# Patient Record
Sex: Male | Born: 1980 | Hispanic: Yes | Marital: Married | Smoking: Never smoker
Health system: Southern US, Community
[De-identification: ages and names within clinical notes are randomized; demographics above are authoritative.]

## PROBLEM LIST (undated history)

## (undated) HISTORY — PX: TONSILLECTOMY: SUR1361

## (undated) HISTORY — PX: SINOSCOPY: SHX187

---

## 2011-04-04 ENCOUNTER — Encounter: Payer: Self-pay | Admitting: Family Medicine

## 2011-04-04 ENCOUNTER — Ambulatory Visit (INDEPENDENT_AMBULATORY_CARE_PROVIDER_SITE_OTHER): Payer: BC Managed Care – PPO | Admitting: Family Medicine

## 2011-04-04 VITALS — BP 125/78 | HR 79 | Ht 64.0 in | Wt 190.0 lb

## 2011-04-04 DIAGNOSIS — Z9109 Other allergy status, other than to drugs and biological substances: Secondary | ICD-10-CM

## 2011-04-04 DIAGNOSIS — Z0000 Encounter for general adult medical examination without abnormal findings: Secondary | ICD-10-CM

## 2011-04-04 DIAGNOSIS — Z23 Encounter for immunization: Secondary | ICD-10-CM

## 2011-04-04 DIAGNOSIS — B353 Tinea pedis: Secondary | ICD-10-CM

## 2011-04-04 DIAGNOSIS — Z889 Allergy status to unspecified drugs, medicaments and biological substances status: Secondary | ICD-10-CM

## 2011-04-04 MED ORDER — FLUTICASONE PROPIONATE 50 MCG/ACT NA SUSP: 2.0000 | Freq: Every day | NASAL | Status: DC

## 2011-04-04 MED ORDER — FEXOFENADINE-PSEUDOEPHED ER 180-240 MG PO TB24: 1.0000 | ORAL_TABLET | Freq: Every day | ORAL | Status: AC

## 2011-04-04 MED ORDER — MOMETASONE FUROATE 0.1 % EX CREA: TOPICAL_CREAM | CUTANEOUS | Status: AC

## 2011-04-04 MED ORDER — KETOCONAZOLE 2 % EX CREA: TOPICAL_CREAM | Freq: Every day | CUTANEOUS | Status: DC

## 2011-04-04 MED ORDER — MOMETASONE FUROATE 50 MCG/ACT NA SUSP: 2.0000 | Freq: Every day | NASAL | Status: DC

## 2011-04-04 NOTE — Patient Instructions (Signed)
Flu vaccine to be given. Health maintenance in a bout 2 months to have blood work drawn 2- 3 days ahead of visit nizoral 2 x a day & elocon qd nasonex as directed and allegra D a day prn

## 2011-04-07 NOTE — Progress Notes (Signed)
  Subjective:    Patient ID: Paul Leon, male    DOB: [birth year], 30 y.o.   MRN: [id number]  HPI #1 Health maintence #2 need for flu vaccination #3 foot peeling/fungal infection #4 allergies  Review of Systems  HENT: Positive for congestion, rhinorrhea and postnasal drip.   Genitourinary:       Due to some infertility challenges they will be seeing afertility doctor in the near future  Skin:       Both feet peeling and itching present  All other systems reviewed and are negative.       Objective:   Physical Exam  Constitutional: He is oriented to person, place, and time. He appears well-developed and well-nourished.  HENT:  Head: Normocephalic.  Right Ear: Hearing normal.  Left Ear: Hearing normal.  Nose: Mucosal edema present. No rhinorrhea, sinus tenderness, nasal deformity or septal deviation. Right sinus exhibits no maxillary sinus tenderness and no frontal sinus tenderness. Left sinus exhibits no maxillary sinus tenderness and no frontal sinus tenderness.  Mouth/Throat: Uvula is midline, oropharynx is clear and moist and mucous membranes are normal. No oral lesions. Normal dentition.  Neck: Neck supple.  Neurological: He is alert and oriented to person, place, and time.  Skin: Skin is warm.       Foot rash consistent with tinea infection          Assessment & Plan:  #1 Will have him return for health maintenance #2 Will be given #3 treat w/steroid for about  A week and antifungal 2x a day for 2-3 weeks  Flu vaccine to be given. Health maintenance in a bout 2 months to have blood work drawn 2- 3 days ahead of visit nizoral 2 x a day & elocon qd nasonex as directed and allegra D a day prn

## 2011-04-18 ENCOUNTER — Encounter: Payer: BC Managed Care – PPO | Admitting: Family Medicine

## 2011-04-18 DIAGNOSIS — Z0289 Encounter for other administrative examinations: Secondary | ICD-10-CM

## 2011-11-15 ENCOUNTER — Telehealth: Payer: Self-pay | Admitting: *Deleted

## 2011-11-15 DIAGNOSIS — B353 Tinea pedis: Secondary | ICD-10-CM

## 2011-11-15 MED ORDER — KETOCONAZOLE 2 % EX CREA: TOPICAL_CREAM | Freq: Every day | CUTANEOUS | Status: AC

## 2011-11-15 NOTE — Telephone Encounter (Signed)
Request refill for ketoconazole 2%. Last filled 03/2011. Please advise on refill.

## 2012-05-09 ENCOUNTER — Other Ambulatory Visit: Payer: Self-pay | Admitting: Family Medicine

## 2019-06-23 DIAGNOSIS — M25622 Stiffness of left elbow, not elsewhere classified: Secondary | ICD-10-CM | POA: Insufficient documentation

## 2020-02-07 ENCOUNTER — Other Ambulatory Visit: Payer: Self-pay

## 2020-02-07 ENCOUNTER — Encounter: Payer: Self-pay | Admitting: Family Medicine

## 2020-02-07 ENCOUNTER — Ambulatory Visit (INDEPENDENT_AMBULATORY_CARE_PROVIDER_SITE_OTHER): Payer: 59 | Admitting: Family Medicine

## 2020-02-07 VITALS — BP 133/84 | HR 85 | Temp 98.0°F | Ht 64.17 in | Wt 225.7 lb

## 2020-02-07 DIAGNOSIS — M25531 Pain in right wrist: Secondary | ICD-10-CM

## 2020-02-07 DIAGNOSIS — M79605 Pain in left leg: Secondary | ICD-10-CM | POA: Insufficient documentation

## 2020-02-07 DIAGNOSIS — R2231 Localized swelling, mass and lump, right upper limb: Secondary | ICD-10-CM | POA: Insufficient documentation

## 2020-02-07 DIAGNOSIS — M79604 Pain in right leg: Secondary | ICD-10-CM

## 2020-02-07 MED ORDER — MELOXICAM 15 MG PO TABS: 15.0000 mg | ORAL_TABLET | Freq: Every day | ORAL | 0 refills | Status: DC | PRN

## 2020-02-07 NOTE — Assessment & Plan Note (Addendum)
Checking CK and CMP today.  Trial of meloxicam as needed.   Reminded to stay well hydrated while working.

## 2020-02-07 NOTE — Assessment & Plan Note (Signed)
Possible ganglion cyst along ulnar side of wrist.  Will try anti-inflammatory initially, if not improving will plan to have him see sports medicine.

## 2020-02-07 NOTE — Patient Instructions (Signed)
Nice to meet you today! Have labs completed downstairs.  Try meloxicam as needed for pain See me again in about 6 weeks.

## 2020-02-07 NOTE — Progress Notes (Signed)
Paul Leon - 39 y.o. male MRN [id number]  Date of birth: 07/11/1980  Subjective Chief Complaint  Patient presents with  . Establish Care    HPI Paul Leon is a 38 y.o. male here today for initial visit.Marland Kitchen  He has been in fairly good health and denies any long term medical problems that he is aware of.   He has concerns today of wrist pain and bilateral leg pain.    Reports that wrist pain started a couple of weeks ago.  He has a nodule on the ulnar side of wrist that is mildly tender.  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  He has not had any associated numbness or tingling.    He also has had bilateral leg pain.  Pain is throughout entire leg. He job does require quite a bit of climbing.  This does make pain worse.  He does stay well hydrated while working.    ROS:  A comprehensive ROS was completed and negative except as noted per HPI  Allergies  Allergen Reactions  . Pork Allergy Rash    History reviewed. No pertinent past medical history.  Past Surgical History:  Procedure Laterality Date  . SINOSCOPY    . TONSILLECTOMY      Social History   Socioeconomic History  . Marital status: Married    Spouse name: Not on file  . Number of children: 1  . Years of education: Not on file  . Highest education level: Not on file  Occupational History  . Not on file  Tobacco Use  . Smoking status: Never Smoker  . Smokeless tobacco: Never Used  Vaping Use  . Vaping Use: Never used  Substance and Sexual Activity  . Alcohol use: Yes    Alcohol/week: 2.0 - 3.0 standard drinks    Types: 2 - 3 Cans of beer per week  . Drug use: Never  . Sexual activity: Yes    Partners: Female  Other Topics Concern  . Not on file  Social History Narrative  . Not on file   Social Determinants of Health   Financial Resource Strain:   . Difficulty of Paying Living Expenses: Not on file  Food Insecurity:   . Worried About Programme researcher, broadcasting/film/video in the Last Year: Not on  file  . Ran Out of Food in the Last Year: Not on file  Transportation Needs:   . Lack of Transportation (Medical): Not on file  . Lack of Transportation (Non-Medical): Not on file  Physical Activity:   . Days of Exercise per Week: Not on file  . Minutes of Exercise per Session: Not on file  Stress:   . Feeling of Stress : Not on file  Social Connections:   . Frequency of Communication with Friends and Family: Not on file  . Frequency of Social Gatherings with Friends and Family: Not on file  . Attends Religious Services: Not on file  . Active Member of Clubs or Organizations: Not on file  . Attends Banker Meetings: Not on file  . Marital Status: Not on file    Family History  Problem Relation Age of Onset  . Leukemia Maternal Grandfather   . Diabetes Paternal Grandfather     Health Maintenance  Topic Date Due  . Hepatitis C Screening  Never done  . HIV Screening  Never done  . COVID-19 Vaccine (2 - Pfizer 2-dose series) 10/13/2019  . INFLUENZA VACCINE  01/16/2020  .  TETANUS/TDAP  07/02/2025     ----------------------------------------------------------------------------------------------------------------------------------------------------------------------------------------------------------------- Physical Exam BP 133/84 (BP Location: Left Arm, Patient Position: Sitting, Cuff Size: Large)   Pulse 85   Temp 98 F (36.7 C) (Temporal)   Ht 5' 4.17" (1.63 m)   Wt 225 lb 11.2 oz (102.4 kg)   SpO2 98%   BMI 38.53 kg/m   Physical Exam Constitutional:      Appearance: Normal appearance.  Eyes:     General: No scleral icterus. Cardiovascular:     Rate and Rhythm: Normal rate and regular rhythm.  Pulmonary:     Effort: Pulmonary effort is normal.     Breath sounds: Normal breath sounds.  Musculoskeletal:     Cervical back: Neck supple.     Comments: Cystic appearing nodule on ulnar side or R wrist.  Mild TTP with radial deviation.    Legs without  TTP.  ROM and strength are normal.    Skin:    General: Skin is warm and dry.  Neurological:     General: No focal deficit present.     Mental Status: He is alert.  Psychiatric:        Mood and Affect: Mood normal.     ------------------------------------------------------------------------------------------------------------------------------------------------------------------------------------------------------------------- Assessment and Plan  Wrist pain Possible ganglion cyst along ulnar side of wrist.  Will try anti-inflammatory initially, if not improving will plan to have him see sports medicine.  Pain in both lower extremities Checking CK and CMP today.  Trial of meloxicam as needed.   Reminded to stay well hydrated while working.     Meds ordered this encounter  Medications  . DISCONTD: meloxicam (MOBIC) 15 MG tablet    Sig: Take 1 tablet (15 mg total) by mouth daily as needed for pain.    Dispense:  30 tablet    Refill:  0  . meloxicam (MOBIC) 15 MG tablet    Sig: Take 1 tablet (15 mg total) by mouth daily as needed for pain.    Dispense:  30 tablet    Refill:  0    Return in about 6 weeks (around 03/20/2020) for leg pain.    This visit occurred during the SARS-CoV-2 public health emergency.  Safety protocols were in place, including screening questions prior to the visit, additional usage of staff PPE, and extensive cleaning of exam room while observing appropriate contact time as indicated for disinfecting solutions.

## 2020-02-08 LAB — COMPLETE METABOLIC PANEL WITH GFR
AG Ratio: 1.8 (calc) (ref 1.0–2.5)
ALT: 31 U/L (ref 9–46)
AST: 16 U/L (ref 10–40)
Albumin: 4.4 g/dL (ref 3.6–5.1)
Alkaline phosphatase (APISO): 93 U/L (ref 36–130)
BUN: 14 mg/dL (ref 7–25)
CO2: 28 mmol/L (ref 20–32)
Calcium: 9.3 mg/dL (ref 8.6–10.3)
Chloride: 103 mmol/L (ref 98–110)
Creat: 0.94 mg/dL (ref 0.60–1.35)
GFR, Est African American: 117 mL/min/{1.73_m2} (ref >=60)
GFR, Est Non African American: 101 mL/min/{1.73_m2} (ref >=60)
Globulin: 2.5 g/dL (calc) (ref 1.9–3.7)
Glucose, Bld: 107 mg/dL (ref 65–139)
Potassium: 4.3 mmol/L (ref 3.5–5.3)
Sodium: 138 mmol/L (ref 135–146)
Total Bilirubin: 0.2 mg/dL (ref 0.2–1.2)
Total Protein: 6.9 g/dL (ref 6.1–8.1)

## 2020-02-08 LAB — CK: Total CK: 181 U/L (ref 44–196)

## 2020-03-05 ENCOUNTER — Other Ambulatory Visit: Payer: Self-pay | Admitting: Family Medicine

## 2020-03-20 ENCOUNTER — Other Ambulatory Visit: Payer: Self-pay

## 2020-03-20 ENCOUNTER — Encounter: Payer: Self-pay | Admitting: Family Medicine

## 2020-03-20 ENCOUNTER — Ambulatory Visit (INDEPENDENT_AMBULATORY_CARE_PROVIDER_SITE_OTHER): Payer: 59 | Admitting: Family Medicine

## 2020-03-20 ENCOUNTER — Ambulatory Visit (INDEPENDENT_AMBULATORY_CARE_PROVIDER_SITE_OTHER): Payer: 59

## 2020-03-20 VITALS — BP 126/86 | HR 86 | Temp 98.0°F | Wt 223.0 lb

## 2020-03-20 DIAGNOSIS — M25472 Effusion, left ankle: Secondary | ICD-10-CM

## 2020-03-20 DIAGNOSIS — M79605 Pain in left leg: Secondary | ICD-10-CM

## 2020-03-20 DIAGNOSIS — M25572 Pain in left ankle and joints of left foot: Secondary | ICD-10-CM

## 2020-03-20 DIAGNOSIS — M25531 Pain in right wrist: Secondary | ICD-10-CM | POA: Diagnosis not present

## 2020-03-20 DIAGNOSIS — M79604 Pain in right leg: Secondary | ICD-10-CM

## 2020-03-20 DIAGNOSIS — K219 Gastro-esophageal reflux disease without esophagitis: Secondary | ICD-10-CM | POA: Diagnosis not present

## 2020-03-20 MED ORDER — OMEPRAZOLE 40 MG PO CPDR: 40.0000 mg | DELAYED_RELEASE_CAPSULE | Freq: Every day | ORAL | 3 refills | Status: DC

## 2020-03-20 NOTE — Assessment & Plan Note (Signed)
Will obtain xray of L ankle.  Does not appear to be related to gout.  Recommend trying compression sleeve or ankle brace when working.

## 2020-03-20 NOTE — Patient Instructions (Signed)
Have xray of left ankle completed.  See Dr. Karie Schwalbe for the area on your wrist.

## 2020-03-20 NOTE — Progress Notes (Signed)
Paul Leon - 39 y.o. male MRN [id number]  Date of birth: [birth year]  Subjective Chief Complaint  Patient presents with  . Leg Pain    HPI Paul Leon is a 39 y.o. male here today here today for follow up of leg and wrist pain.  R leg pain has improved but he continues to have L ankle pain.  He is now having some swelling of his ankle. He denies any known injury of this ankle.  He denies numbness/tingling of the foot.  There is no redness or warmth.   He continues to have pain in the right wrist.  He has some numbness in 4th and 5th digit.  He has cystic like mass over ulna.  This area is non-tender.   ROS:  A comprehensive ROS was completed and negative except as noted per HPI  Allergies  Allergen Reactions  . Pork Allergy Rash    History reviewed. No pertinent past medical history.  Past Surgical History:  Procedure Laterality Date  . SINOSCOPY    . TONSILLECTOMY      Social History   Socioeconomic History  . Marital status: Married    Spouse name: Not on file  . Number of children: 1  . Years of education: Not on file  . Highest education level: Not on file  Occupational History  . Not on file  Tobacco Use  . Smoking status: Never Smoker  . Smokeless tobacco: Never Used  Vaping Use  . Vaping Use: Never used  Substance and Sexual Activity  . Alcohol use: Yes    Alcohol/week: 2.0 - 3.0 standard drinks    Types: 2 - 3 Cans of beer per week  . Drug use: Never  . Sexual activity: Yes    Partners: Female  Other Topics Concern  . Not on file  Social History Narrative  . Not on file   Social Determinants of Health   Financial Resource Strain:   . Difficulty of Paying Living Expenses: Not on file  Food Insecurity:   . Worried About Programme researcher, broadcasting/film/video in the Last Year: Not on file  . Ran Out of Food in the Last Year: Not on file  Transportation Needs:   . Lack of Transportation (Medical): Not on file  . Lack of Transportation (Non-Medical): Not on file   Physical Activity:   . Days of Exercise per Week: Not on file  . Minutes of Exercise per Session: Not on file  Stress:   . Feeling of Stress : Not on file  Social Connections:   . Frequency of Communication with Friends and Family: Not on file  . Frequency of Social Gatherings with Friends and Family: Not on file  . Attends Religious Services: Not on file  . Active Member of Clubs or Organizations: Not on file  . Attends Banker Meetings: Not on file  . Marital Status: Not on file    Family History  Problem Relation Age of Onset  . Leukemia Maternal Grandfather   . Diabetes Paternal Grandfather     Health Maintenance  Topic Date Due  . Hepatitis C Screening  Never done  . HIV Screening  Never done  . COVID-19 Vaccine (2 - Pfizer 2-dose series) 10/13/2019  . INFLUENZA VACCINE  01/16/2020  . TETANUS/TDAP  07/02/2025     ----------------------------------------------------------------------------------------------------------------------------------------------------------------------------------------------------------------- Physical Exam BP 126/86   Pulse 86   Temp 98 F (36.7 C)   Wt 223 lb (101.2 kg)   SpO2 94%  BMI 38.07 kg/m   Physical Exam Constitutional:      Appearance: Normal appearance.  HENT:     Head: Normocephalic and atraumatic.  Eyes:     General: No scleral icterus. Cardiovascular:     Rate and Rhythm: Normal rate and regular rhythm.  Pulmonary:     Effort: Pulmonary effort is normal.     Breath sounds: Normal breath sounds.  Musculoskeletal:     Cervical back: Neck supple.  Neurological:     General: No focal deficit present.     Mental Status: He is alert.  Psychiatric:        Mood and Affect: Mood normal.        Behavior: Behavior normal.      ------------------------------------------------------------------------------------------------------------------------------------------------------------------------------------------------------------------- Assessment and Plan  Wrist pain He has symptoms consistent with ulnar neuropathy.  Possibly due to compression from cystic mass on ulna.  I favor this to be a ganglion cyst and will have him see Dr. Karie Schwalbe for further evaluation and management.  Pain in both lower extremities Will obtain xray of L ankle.  Does not appear to be related to gout.  Recommend trying compression sleeve or ankle brace when working.    GERD (gastroesophageal reflux disease) Previously on omeprazole, will restart at 40mg  daily.     Meds ordered this encounter  Medications  . omeprazole (PRILOSEC) 40 MG capsule    Sig: Take 1 capsule (40 mg total) by mouth daily.    Dispense:  30 capsule    Refill:  3    No follow-ups on file.    This visit occurred during the SARS-CoV-2 public health emergency.  Safety protocols were in place, including screening questions prior to the visit, additional usage of staff PPE, and extensive cleaning of exam room while observing appropriate contact time as indicated for disinfecting solutions.

## 2020-03-20 NOTE — Assessment & Plan Note (Signed)
Previously on omeprazole, will restart at 40mg  daily.

## 2020-03-20 NOTE — Assessment & Plan Note (Signed)
He has symptoms consistent with ulnar neuropathy.  Possibly due to compression from cystic mass on ulna.  I favor this to be a ganglion cyst and will have him see Dr. Karie Schwalbe for further evaluation and management.

## 2020-03-27 ENCOUNTER — Encounter: Payer: Self-pay | Admitting: Sports Medicine

## 2020-03-27 ENCOUNTER — Other Ambulatory Visit: Payer: Self-pay

## 2020-03-27 ENCOUNTER — Ambulatory Visit: Payer: Self-pay

## 2020-03-27 ENCOUNTER — Ambulatory Visit (INDEPENDENT_AMBULATORY_CARE_PROVIDER_SITE_OTHER): Payer: 59

## 2020-03-27 ENCOUNTER — Ambulatory Visit (INDEPENDENT_AMBULATORY_CARE_PROVIDER_SITE_OTHER): Payer: 59 | Admitting: Sports Medicine

## 2020-03-27 DIAGNOSIS — M25562 Pain in left knee: Secondary | ICD-10-CM

## 2020-03-27 DIAGNOSIS — M1712 Unilateral primary osteoarthritis, left knee: Secondary | ICD-10-CM

## 2020-03-27 DIAGNOSIS — R2231 Localized swelling, mass and lump, right upper limb: Secondary | ICD-10-CM

## 2020-03-27 NOTE — Assessment & Plan Note (Signed)
There is some complaints of swelling in his left lower leg, he has a negative Homans' sign, he does have tenderness at the medial joint line, moderate knee pain as well. I think the swelling is from his knee, injected today because he did fail meloxicam. Getting some x-rays. Return to see me in 4 to 6 weeks.

## 2020-03-27 NOTE — Assessment & Plan Note (Addendum)
Paul Leon also has a mass on his right wrist, over the ECU. I think it is a ganglion, I am going to go ahead and drain and inject this today as well. Return to see me in a month.

## 2020-03-27 NOTE — Progress Notes (Signed)
    Procedures performed today:    Procedure: Real-time Ultrasound Guided injection of the left knee Device: Samsung HS60  Verbal informed consent obtained.  Time-out conducted.  Noted no overlying erythema, induration, or other signs of local infection.  Skin prepped in a sterile fashion.  Local anesthesia: Topical Ethyl chloride.  With sterile technique and under real time ultrasound guidance: 1 cc Kenalog 40, 2 cc lidocaine, 2 cc bupivacaine injected easily Completed without difficulty  Pain immediately resolved suggesting accurate placement of the medication.  Advised to call if fevers/chills, erythema, induration, drainage, or persistent bleeding.  Images permanently stored and available for review in PACS.  Impression: Technically successful ultrasound guided injection.  Procedure: Real-time Ultrasound Guided aspiration/injection of right ulnar wrist ganglion Device: Samsung HS60  Verbal informed consent obtained.  Time-out conducted.  Noted no overlying erythema, induration, or other signs of local infection.  Skin prepped in a sterile fashion.  Local anesthesia: Topical Ethyl chloride.  With sterile technique and under real time ultrasound guidance:  Using 18-gauge needle I aspirated a few milliliters of thick, straw-colored fluid, syringe switched and 1 cc kenalog 40 injected easily.   Completed without difficulty  Pain immediately resolved suggesting accurate placement of the medication.  Advised to call if fevers/chills, erythema, induration, drainage, or persistent bleeding.  Images permanently stored and available for review in PACS.  Impression: Technically successful ultrasound guided injection.  Independent interpretation of notes and tests performed by another provider:   None.  Brief History, Exam, Impression, and Recommendations:    Mass of right wrist Lloyd Huger also has a mass on his right wrist, over the ECU. I think it is a ganglion, I am going to go ahead and  drain and inject this today as well. Return to see me in a month.  Primary osteoarthritis of left knee There is some complaints of swelling in his left lower leg, he has a negative Homans' sign, he does have tenderness at the medial joint line, moderate knee pain as well. I think the swelling is from his knee, injected today because he did fail meloxicam. Getting some x-rays. Return to see me in 4 to 6 weeks.    ___________________________________________ Paul Leon. Benjamin Stain, M.D., ABFM., CAQSM. Primary Care and Sports Medicine [facility]  Adjunct Instructor of Family Medicine  [employer]

## 2020-03-28 ENCOUNTER — Telehealth: Payer: Self-pay | Admitting: *Deleted

## 2020-03-28 NOTE — Telephone Encounter (Signed)
Letter written

## 2020-03-28 NOTE — Telephone Encounter (Signed)
Pt's wife left vm that yesterday during the appointment the pt denied wanting to be written out of work for this next week but has changed his mind. He would like for you to write the letter now.

## 2020-03-28 NOTE — Telephone Encounter (Signed)
Pt's wife notified of letter.

## 2020-04-24 ENCOUNTER — Ambulatory Visit (INDEPENDENT_AMBULATORY_CARE_PROVIDER_SITE_OTHER): Payer: 59 | Admitting: Sports Medicine

## 2020-04-24 DIAGNOSIS — M1712 Unilateral primary osteoarthritis, left knee: Secondary | ICD-10-CM

## 2020-04-24 NOTE — Progress Notes (Signed)
    Procedures performed today:    None.  Independent interpretation of notes and tests performed by another provider:   None.  Brief History, Exam, Impression, and Recommendations:    Primary osteoarthritis of left knee This is a very pleasant 39 year old male, we injected his knee at the last visit, he did extremely well for the first week or 2, and then started to have a slight recurrence of pain, he is still 50 to 60% better. He has really not been taking his meloxicam so he will get more diligent with this, starting at one half tab at lunchtime. Pain is only when he gets home. He will also wear knee brace, return to see me in a month, if persistent discomfort we will proceed with MRI.    ___________________________________________ Ihor Austin. Benjamin Stain, M.D., ABFM., CAQSM. Primary Care and Sports Medicine [facility]  Adjunct Instructor of Family Medicine  [employer]

## 2020-04-24 NOTE — Assessment & Plan Note (Signed)
This is a very pleasant 40 year old male, we injected his knee at the last visit, he did extremely well for the first week or 2, and then started to have a slight recurrence of pain, he is still 50 to 60% better. He has really not been taking his meloxicam so he will get more diligent with this, starting at one half tab at lunchtime. Pain is only when he gets home. He will also wear knee brace, return to see me in a month, if persistent discomfort we will proceed with MRI.

## 2020-05-22 ENCOUNTER — Ambulatory Visit: Payer: 59 | Admitting: Family Medicine

## 2020-06-19 ENCOUNTER — Encounter: Payer: Self-pay | Admitting: Physician Assistant

## 2020-06-19 ENCOUNTER — Telehealth (INDEPENDENT_AMBULATORY_CARE_PROVIDER_SITE_OTHER): Payer: 59 | Admitting: Physician Assistant

## 2020-06-19 VITALS — Temp 98.9°F

## 2020-06-19 DIAGNOSIS — J40 Bronchitis, not specified as acute or chronic: Secondary | ICD-10-CM | POA: Diagnosis not present

## 2020-06-19 DIAGNOSIS — J329 Chronic sinusitis, unspecified: Secondary | ICD-10-CM | POA: Diagnosis not present

## 2020-06-19 MED ORDER — AZITHROMYCIN 250 MG PO TABS: ORAL_TABLET | ORAL | 0 refills | Status: DC

## 2020-06-19 MED ORDER — FLUTICASONE PROPIONATE 50 MCG/ACT NA SUSP: 2.0000 | Freq: Every day | NASAL | 2 refills | Status: DC

## 2020-06-19 MED ORDER — FLUTICASONE PROPIONATE 50 MCG/ACT NA SUSP
2.0000 | Freq: Every day | NASAL | 1 refills | Status: DC
Start: 2020-06-19 — End: 2021-05-17

## 2020-06-19 NOTE — Progress Notes (Signed)
..  Virtual Visit via Telephone Note  I connected with Paul Leon on 06/19/2020 at  3:20 PM EST by telephone and verified that I am speaking with the correct person using two identifiers.  Location: Patient: home Provider: home  .Marland KitchenParticipating in visit:  Patient: Paul Leon Patient wife: [name] Provider: Tandy Gaw PA-C   I discussed the limitations, risks, security and privacy concerns of performing an evaluation and management service by telephone and the availability of in person appointments. I also discussed with the patient that there may be a patient responsible charge related to this service. The patient expressed understanding and agreed to proceed.   History of Present Illness: Pt is a 40 yo male who calls into the clinic with his wife. he started having URI symptoms about 4 days ago. His wife was sick about 4 days before that. She was seen by Dr. Linford Arnold and given zpak after flu/covid negative testing. His wife states "he has the same symptoms". theraflu does help some. He has some head congestion, sinus pressure, runny nose and cough. No wheezing or SOB. His has had covid vaccine. No flu shot.  .. Active Ambulatory Problems    Diagnosis Date Noted  . Elbow stiffness, left 06/23/2019  . Pain in both lower extremities 02/07/2020  . Mass of right wrist 02/07/2020  . GERD (gastroesophageal reflux disease) 03/20/2020  . Primary osteoarthritis of left knee 03/27/2020   Resolved Ambulatory Problems    Diagnosis Date Noted  . No Resolved Ambulatory Problems   No Additional Past Medical History   Reviewed med, allergy, problem list.     Observations/Objective: No acute distress Normal breathing and speaking in sentences Productive cough  Temp 98.9    Assessment and Plan: Marland KitchenMarland KitchenDiagnoses and all orders for this visit:  Sinobronchitis -     azithromycin (ZITHROMAX Z-PAK) 250 MG tablet; Take 2 tablets (500 mg) on  Day 1,  followed by 1 tablet (250 mg) once daily  on Days 2 through 5. -     fluticasone (FLONASE) 50 MCG/ACT nasal spray; Place 2 sprays into both nostrils daily. -     fluticasone (FLONASE) 50 MCG/ACT nasal spray; Place 2 sprays into both nostrils daily.   Discussed with patient symptoms sound more viral. Wife did test negative and did have household exposure. No new testing needed. Continue to wear a mask. I would continue theraflu and flonase for another 2 to 3 days. If not improving then add zpak. Wife wanted what she was given to make well. Discussed if added too soon may not be needed and cause abx resistance and GI issues. Rest and hydrate. Wait at least 5 days from symptoms and fever free for 24 hours to go back to work.     Follow Up Instructions:    I discussed the assessment and treatment plan with the patient. The patient was provided an opportunity to ask questions and all were answered. The patient agreed with the plan and demonstrated an understanding of the instructions.   The patient was advised to call back or seek an in-person evaluation if the symptoms worsen or if the condition fails to improve as anticipated.  I provided 10 minutes of non-face-to-face time during this encounter.   Tandy Gaw, PA-C

## 2020-06-20 ENCOUNTER — Other Ambulatory Visit: Payer: Self-pay

## 2020-06-20 ENCOUNTER — Encounter: Payer: Self-pay | Admitting: Physician Assistant

## 2020-06-20 MED ORDER — MELOXICAM 15 MG PO TABS: 15.0000 mg | ORAL_TABLET | Freq: Every day | ORAL | 0 refills | Status: DC

## 2020-07-18 ENCOUNTER — Other Ambulatory Visit: Payer: Self-pay | Admitting: Family Medicine

## 2020-10-12 ENCOUNTER — Other Ambulatory Visit: Payer: Self-pay | Admitting: Family Medicine

## 2020-10-25 ENCOUNTER — Ambulatory Visit (INDEPENDENT_AMBULATORY_CARE_PROVIDER_SITE_OTHER): Payer: BC Managed Care – PPO

## 2020-10-25 ENCOUNTER — Ambulatory Visit (INDEPENDENT_AMBULATORY_CARE_PROVIDER_SITE_OTHER): Payer: BC Managed Care – PPO | Admitting: Family Medicine

## 2020-10-25 ENCOUNTER — Other Ambulatory Visit: Payer: Self-pay

## 2020-10-25 ENCOUNTER — Encounter: Payer: Self-pay | Admitting: Family Medicine

## 2020-10-25 VITALS — BP 122/76 | HR 75 | Temp 98.0°F | Ht 64.57 in | Wt 230.2 lb

## 2020-10-25 DIAGNOSIS — M25571 Pain in right ankle and joints of right foot: Secondary | ICD-10-CM

## 2020-10-25 DIAGNOSIS — M25572 Pain in left ankle and joints of left foot: Secondary | ICD-10-CM

## 2020-10-25 DIAGNOSIS — E782 Mixed hyperlipidemia: Secondary | ICD-10-CM | POA: Insufficient documentation

## 2020-10-25 DIAGNOSIS — M7989 Other specified soft tissue disorders: Secondary | ICD-10-CM | POA: Diagnosis not present

## 2020-10-25 NOTE — Assessment & Plan Note (Addendum)
Symptoms suspicious for gout.  Xrays ordered.  Check uric acid levels and renal function.  He has meloxicam at home and may use this as needed.

## 2020-10-25 NOTE — Assessment & Plan Note (Signed)
Previously on atorvastatin  Update lipid panel.

## 2020-10-25 NOTE — Progress Notes (Signed)
Paul Leon - 40 y.o. male MRN [id number]  Date of birth: 1980-07-10  Subjective Chief Complaint  Patient presents with  . Foot Swelling    HPI Paul Leon is a 40 y.o. male here today with complaint of bilateral ankle swelling and pain.  He has had this off and on in the past.  Most recent episode started a few days ago.  Pain worse when walking.  Did have some warmth to the ankles as well, worse on the R.  She denies numbness or tingling.  He has had knee pain but denies pain in other joints.  His most recent episode did start after having a large meal at a [employer].  Denies fever, chills, chest pain or shortness of breath.    ROS:  A comprehensive ROS was completed and negative except as noted per HPI  Allergies  Allergen Reactions  . Pork Allergy Rash    History reviewed. No pertinent past medical history.  Past Surgical History:  Procedure Laterality Date  . SINOSCOPY    . TONSILLECTOMY      Social History   Socioeconomic History  . Marital status: Married    Spouse name: Not on file  . Number of children: 1  . Years of education: Not on file  . Highest education level: Not on file  Occupational History  . Not on file  Tobacco Use  . Smoking status: Never Smoker  . Smokeless tobacco: Never Used  Vaping Use  . Vaping Use: Never used  Substance and Sexual Activity  . Alcohol use: Yes    Alcohol/week: 2.0 - 3.0 standard drinks    Types: 2 - 3 Cans of beer per week  . Drug use: Never  . Sexual activity: Yes    Partners: Female  Other Topics Concern  . Not on file  Social History Narrative  . Not on file   Social Determinants of Health   Financial Resource Strain: Not on file  Food Insecurity: Not on file  Transportation Needs: Not on file  Physical Activity: Not on file  Stress: Not on file  Social Connections: Not on file    Family History  Problem Relation Age of Onset  . Leukemia Maternal Grandfather   . Diabetes Paternal Grandfather      Health Maintenance  Topic Date Due  . HIV Screening  Never done  . Hepatitis C Screening  Never done  . COVID-19 Vaccine (3 - Booster for Pfizer series) 03/22/2020  . INFLUENZA VACCINE  01/15/2021  . TETANUS/TDAP  07/02/2025  . HPV VACCINES  Aged Out     ----------------------------------------------------------------------------------------------------------------------------------------------------------------------------------------------------------------- Physical Exam BP 122/76 (BP Location: Left Arm, Patient Position: Sitting, Cuff Size: Large)   Pulse 75   Temp 98 F (36.7 C)   Ht 5' 4.57" (1.64 m)   Wt 230 lb 3.2 oz (104.4 kg)   SpO2 97%   BMI 38.82 kg/m   Physical Exam Constitutional:      Appearance: Normal appearance.  HENT:     Head: Normocephalic and atraumatic.  Cardiovascular:     Rate and Rhythm: Normal rate and regular rhythm.  Pulmonary:     Effort: Pulmonary effort is normal.     Breath sounds: Normal breath sounds.  Musculoskeletal:     Comments: Mild swelling and ttp along bilateral ankle joints without erythema.  Neurological:     Mental Status: He is alert.     ------------------------------------------------------------------------------------------------------------------------------------------------------------------------------------------------------------------- Assessment and Plan  Mixed hyperlipidemia Previously on atorvastatin  Update  lipid panel.   Acute bilateral ankle pain Symptoms suspicious for gout.  Xrays ordered.  Check uric acid levels and renal function.  He has meloxicam at home and may use this as needed.     No orders of the defined types were placed in this encounter.   No follow-ups on file.    This visit occurred during the SARS-CoV-2 public health emergency.  Safety protocols were in place, including screening questions prior to the visit, additional usage of staff PPE, and extensive cleaning of  exam room while observing appropriate contact time as indicated for disinfecting solutions.

## 2020-10-25 NOTE — Patient Instructions (Signed)
Have labs and xrays completed.  We'll be in touch with results.  Try meloxicam as needed.

## 2020-10-26 LAB — COMPLETE METABOLIC PANEL WITH GFR
AG Ratio: 1.5 (calc) (ref 1.0–2.5)
ALT: 53 U/L — ABNORMAL HIGH (ref 9–46)
AST: 30 U/L (ref 10–40)
Albumin: 4.4 g/dL (ref 3.6–5.1)
Alkaline phosphatase (APISO): 91 U/L (ref 36–130)
BUN: 15 mg/dL (ref 7–25)
CO2: 24 mmol/L (ref 20–32)
Calcium: 9.7 mg/dL (ref 8.6–10.3)
Chloride: 103 mmol/L (ref 98–110)
Creat: 0.78 mg/dL (ref 0.60–1.35)
GFR, Est African American: 131 mL/min/{1.73_m2} (ref >=60)
GFR, Est Non African American: 113 mL/min/{1.73_m2} (ref >=60)
Globulin: 3 g/dL (calc) (ref 1.9–3.7)
Glucose, Bld: 88 mg/dL (ref 65–99)
Potassium: 4.2 mmol/L (ref 3.5–5.3)
Sodium: 138 mmol/L (ref 135–146)
Total Bilirubin: 0.4 mg/dL (ref 0.2–1.2)
Total Protein: 7.4 g/dL (ref 6.1–8.1)

## 2020-10-26 LAB — LIPID PANEL W/REFLEX DIRECT LDL
Cholesterol: 247 mg/dL — ABNORMAL HIGH (ref <200)
HDL: 45 mg/dL (ref >=40)
LDL Cholesterol (Calc): 154 mg/dL (calc) — ABNORMAL HIGH
Non-HDL Cholesterol (Calc): 202 mg/dL (calc) — ABNORMAL HIGH (ref <130)
Total CHOL/HDL Ratio: 5.5 (calc) — ABNORMAL HIGH (ref <5.0)
Triglycerides: 291 mg/dL — ABNORMAL HIGH (ref <150)

## 2020-10-26 LAB — URIC ACID: Uric Acid, Serum: 7.1 mg/dL (ref 4.0–8.0)

## 2020-10-26 LAB — TSH: TSH: 2.1 mIU/L (ref 0.40–4.50)

## 2020-10-27 ENCOUNTER — Encounter: Payer: Self-pay | Admitting: Family Medicine

## 2020-10-30 ENCOUNTER — Other Ambulatory Visit: Payer: Self-pay | Admitting: Family Medicine

## 2020-10-30 MED ORDER — PREDNISONE 50 MG PO TABS: ORAL_TABLET | ORAL | 5 refills | Status: DC

## 2020-11-14 ENCOUNTER — Other Ambulatory Visit: Payer: Self-pay | Admitting: Family Medicine

## 2020-12-27 ENCOUNTER — Other Ambulatory Visit: Payer: Self-pay | Admitting: Family Medicine

## 2021-01-23 ENCOUNTER — Other Ambulatory Visit: Payer: Self-pay | Admitting: Family Medicine

## 2021-02-21 ENCOUNTER — Other Ambulatory Visit: Payer: Self-pay | Admitting: Family Medicine

## 2021-04-24 DIAGNOSIS — Z3009 Encounter for other general counseling and advice on contraception: Secondary | ICD-10-CM | POA: Diagnosis not present

## 2021-04-25 DIAGNOSIS — S76012A Strain of muscle, fascia and tendon of left hip, initial encounter: Secondary | ICD-10-CM | POA: Diagnosis not present

## 2021-04-25 DIAGNOSIS — X501XXA Overexertion from prolonged static or awkward postures, initial encounter: Secondary | ICD-10-CM | POA: Diagnosis not present

## 2021-04-25 DIAGNOSIS — M25552 Pain in left hip: Secondary | ICD-10-CM | POA: Diagnosis not present

## 2021-04-25 DIAGNOSIS — Z91018 Allergy to other foods: Secondary | ICD-10-CM | POA: Diagnosis not present

## 2021-04-25 DIAGNOSIS — M79605 Pain in left leg: Secondary | ICD-10-CM | POA: Diagnosis not present

## 2021-04-25 DIAGNOSIS — M25662 Stiffness of left knee, not elsewhere classified: Secondary | ICD-10-CM | POA: Diagnosis not present

## 2021-04-25 DIAGNOSIS — Z7951 Long term (current) use of inhaled steroids: Secondary | ICD-10-CM | POA: Diagnosis not present

## 2021-04-25 DIAGNOSIS — G8911 Acute pain due to trauma: Secondary | ICD-10-CM | POA: Diagnosis not present

## 2021-05-17 ENCOUNTER — Encounter: Payer: Self-pay | Admitting: Family Medicine

## 2021-05-17 ENCOUNTER — Other Ambulatory Visit: Payer: Self-pay

## 2021-05-17 ENCOUNTER — Ambulatory Visit (INDEPENDENT_AMBULATORY_CARE_PROVIDER_SITE_OTHER): Payer: BC Managed Care – PPO | Admitting: Family Medicine

## 2021-05-17 VITALS — BP 131/71 | HR 89 | Temp 98.3°F | Ht 65.0 in | Wt 222.0 lb

## 2021-05-17 DIAGNOSIS — J0100 Acute maxillary sinusitis, unspecified: Secondary | ICD-10-CM

## 2021-05-17 MED ORDER — AMOXICILLIN 875 MG PO TABS: 875.0000 mg | ORAL_TABLET | Freq: Two times a day (BID) | ORAL | 0 refills | Status: DC

## 2021-05-17 NOTE — Progress Notes (Signed)
Acute Office Visit  Subjective:    Patient ID: Paul Leon, male    DOB: [birth year], 40 y.o.   MRN: [id number]  No chief complaint on file.   HPI Patient is in today for severe nasal congestion and some postnasal drip.  Mild cough.  He has had some sore throat.  No ear pain.  No GI symptoms.  No fevers or chills or sweats.  His wife was seen last week and diagnosed with a sinusitis.  She started feeling bad last week.  His son has been sick with similar symptoms as well but he has been running a fever for 3 days.  No well has not been running a fever.  No loose stools.  Negative home COVID test.  Wife tested negative for flu earlier this week.  He was afraid he might give it to his other children.  History reviewed. No pertinent past medical history.  Past Surgical History:  Procedure Laterality Date   SINOSCOPY     TONSILLECTOMY      Family History  Problem Relation Age of Onset   Leukemia Maternal Grandfather    Diabetes Paternal Grandfather     Social History   Socioeconomic History   Marital status: Married    Spouse name: Not on file   Number of children: 1   Years of education: Not on file   Highest education level: Not on file  Occupational History   Not on file  Tobacco Use   Smoking status: Never   Smokeless tobacco: Never  Vaping Use   Vaping Use: Never used  Substance and Sexual Activity   Alcohol use: Yes    Alcohol/week: 2.0 - 3.0 standard drinks    Types: 2 - 3 Cans of beer per week   Drug use: Never   Sexual activity: Yes    Partners: Female  Other Topics Concern   Not on file  Social History Narrative   Not on file   Social Determinants of Health   Financial Resource Strain: Not on file  Food Insecurity: Not on file  Transportation Needs: Not on file  Physical Activity: Not on file  Stress: Not on file  Social Connections: Not on file  Intimate Partner Violence: Not on file    Outpatient Medications Prior to Visit  Medication Sig  Dispense Refill   atorvastatin (LIPITOR) 10 MG tablet Take by mouth.  (Patient not taking: Reported on 10/25/2020)     fluticasone (FLONASE) 50 MCG/ACT nasal spray Place 2 sprays into both nostrils daily. 16 g 1   meloxicam (MOBIC) 15 MG tablet TAKE 1 TABLET BY MOUTH EVERY DAY 30 tablet 0   omeprazole (PRILOSEC) 40 MG capsule TAKE 1 CAPSULE BY MOUTH EVERY DAY 90 capsule 0   predniSONE (DELTASONE) 50 MG tablet Take 1 tab daily x5 days. 5 tablet 5   No facility-administered medications prior to visit.    Allergies  Allergen Reactions   Pork Allergy Rash    Review of Systems     Objective:    Physical Exam Constitutional:      Appearance: He is well-developed.  HENT:     Head: Normocephalic and atraumatic.     Right Ear: External ear normal.     Left Ear: External ear normal.     Nose: Nose normal.  Eyes:     Conjunctiva/sclera: Conjunctivae normal.     Pupils: Pupils are equal, round, and reactive to light.  Neck:     Thyroid: No thyromegaly.  Cardiovascular:     Rate and Rhythm: Normal rate.     Heart sounds: Normal heart sounds.  Pulmonary:     Effort: Pulmonary effort is normal.     Breath sounds: Normal breath sounds.  Musculoskeletal:     Cervical back: Neck supple.  Lymphadenopathy:     Cervical: No cervical adenopathy.  Skin:    General: Skin is warm and dry.  Neurological:     Mental Status: He is alert and oriented to person, place, and time.    BP 131/71   Pulse 89   Temp 98.3 F (36.8 C)   Ht 5\' 5"  (1.651 m)   Wt 222 lb (100.7 kg)   SpO2 98%   BMI 36.94 kg/m  Wt Readings from Last 3 Encounters:  05/17/21 222 lb (100.7 kg)  10/25/20 230 lb 3.2 oz (104.4 kg)  03/20/20 223 lb (101.2 kg)    Health Maintenance Due  Topic Date Due   HIV Screening  Never done   Hepatitis C Screening  Never done   COVID-19 Vaccine (3 - Booster for Pfizer series) 11/16/2019   INFLUENZA VACCINE  01/15/2021    There are no preventive care reminders to display for  this patient.   Lab Results  Component Value Date   TSH 2.10 10/25/2020   No results found for: WBC, HGB, HCT, MCV, PLT Lab Results  Component Value Date   NA 138 10/25/2020   K 4.2 10/25/2020   CO2 24 10/25/2020   GLUCOSE 88 10/25/2020   BUN 15 10/25/2020   CREATININE 0.78 10/25/2020   BILITOT 0.4 10/25/2020   AST 30 10/25/2020   ALT 53 (H) 10/25/2020   PROT 7.4 10/25/2020   CALCIUM 9.7 10/25/2020   Lab Results  Component Value Date   CHOL 247 (H) 10/25/2020   Lab Results  Component Value Date   HDL 45 10/25/2020   Lab Results  Component Value Date   LDLCALC 154 (H) 10/25/2020   Lab Results  Component Value Date   TRIG 291 (H) 10/25/2020   Lab Results  Component Value Date   CHOLHDL 5.5 (H) 10/25/2020   No results found for: HGBA1C     Assessment & Plan:   Problem List Items Addressed This Visit   None Visit Diagnoses     Acute non-recurrent maxillary sinusitis    -  Primary   Relevant Medications   amoxicillin (AMOXIL) 875 MG tablet      Acute sinusitis-likely viral.  It sounds like it is going around the family.  Came in today because he says his wife was treated with antibiotics that he felt he would probably benefit as well we discussed that most the time this is viral and I would really encourage him to give it a week if he is not feeling better at that point then okay to fill the prescription.  Just reminded him that most of the time this goes away on its own.  I did not test for Breo since his wife was just tested 2 days ago and was negative and he did do an at home COVID test which was also negative  Meds ordered this encounter  Medications   amoxicillin (AMOXIL) 875 MG tablet    Sig: Take 1 tablet (875 mg total) by mouth 2 (two) times daily.    Dispense:  14 tablet    Refill:  0     12/25/2020, MD

## 2021-05-17 NOTE — Progress Notes (Signed)
Facial pressure,headache, nasal and chest congestion, cough. He reports that the mucus has been green. His wife was sick last week w/sinusitis and given an ABX.

## 2021-06-27 ENCOUNTER — Other Ambulatory Visit: Payer: Self-pay | Admitting: Family Medicine

## 2021-06-27 ENCOUNTER — Telehealth: Payer: Self-pay | Admitting: Family Medicine

## 2021-06-27 NOTE — Telephone Encounter (Signed)
Patient wife Paul Leon called to see if the patient could be put back on his Atorvastatin. She wanted to know if a prescription could be sent now or if an appointment is needed before. Please advise.

## 2021-06-28 MED ORDER — ATORVASTATIN CALCIUM 10 MG PO TABS: 10.0000 mg | ORAL_TABLET | Freq: Every day | ORAL | 1 refills | Status: DC

## 2021-06-28 NOTE — Telephone Encounter (Signed)
Pt has been advised of Rx refill and lipid check in 3 months.

## 2021-06-28 NOTE — Telephone Encounter (Signed)
Rx ordered.  Recheck lipids in 3 months.

## 2021-09-13 ENCOUNTER — Other Ambulatory Visit: Payer: Self-pay | Admitting: Family Medicine

## 2021-10-08 ENCOUNTER — Other Ambulatory Visit: Payer: Self-pay | Admitting: Family Medicine

## 2021-10-08 NOTE — Telephone Encounter (Signed)
Patient has been scheduled with PCP for 11/26/21. AMUCK ?

## 2021-10-08 NOTE — Telephone Encounter (Signed)
Pls contact the pt to schedule appt. Last appt 5/22. ?Sending 30 day refill. Thanks ?

## 2021-11-05 ENCOUNTER — Other Ambulatory Visit: Payer: Self-pay | Admitting: Family Medicine

## 2021-11-26 ENCOUNTER — Ambulatory Visit (INDEPENDENT_AMBULATORY_CARE_PROVIDER_SITE_OTHER): Payer: BC Managed Care – PPO | Admitting: Family Medicine

## 2021-11-26 ENCOUNTER — Encounter: Payer: Self-pay | Admitting: Family Medicine

## 2021-11-26 VITALS — BP 131/80 | HR 75 | Ht 65.0 in | Wt 231.0 lb

## 2021-11-26 DIAGNOSIS — M79671 Pain in right foot: Secondary | ICD-10-CM | POA: Diagnosis not present

## 2021-11-26 DIAGNOSIS — K219 Gastro-esophageal reflux disease without esophagitis: Secondary | ICD-10-CM | POA: Diagnosis not present

## 2021-11-26 DIAGNOSIS — E782 Mixed hyperlipidemia: Secondary | ICD-10-CM | POA: Diagnosis not present

## 2021-11-26 DIAGNOSIS — M1712 Unilateral primary osteoarthritis, left knee: Secondary | ICD-10-CM

## 2021-11-26 DIAGNOSIS — K625 Hemorrhage of anus and rectum: Secondary | ICD-10-CM

## 2021-11-26 DIAGNOSIS — M79672 Pain in left foot: Secondary | ICD-10-CM

## 2021-11-26 MED ORDER — OMEPRAZOLE 40 MG PO CPDR
DELAYED_RELEASE_CAPSULE | ORAL | 2 refills | Status: DC
Start: 2021-11-26 — End: 2022-06-24

## 2021-11-26 MED ORDER — MELOXICAM 15 MG PO TABS: 15.0000 mg | ORAL_TABLET | Freq: Every day | ORAL | 3 refills | Status: DC

## 2021-11-26 MED ORDER — ATORVASTATIN CALCIUM 10 MG PO TABS: 10.0000 mg | ORAL_TABLET | Freq: Every day | ORAL | 3 refills | Status: DC

## 2021-11-26 NOTE — Assessment & Plan Note (Signed)
Hemorrhoidal bleeding suspected.  Discussed increasing fiber intake. He will let me know if this becomes more frequent or persistent.

## 2021-11-26 NOTE — Assessment & Plan Note (Signed)
Reflux is not well controlled.  Discussed dietary modifications.  Will increase omeprazole to BID dosing for a couple of weeks.

## 2021-11-26 NOTE — Patient Instructions (Addendum)
Increase omeprazole to twice per day for the next two weeks then decrease to once per day.   Try adding fiber each day.  If you continue to notice blood in your stool please let me know.   Have labs completed when fasting.    Opciones de alimentos para pacientes adultos con enfermedad de reflujo gastroesofgico Food Choices for Gastroesophageal Reflux Disease, Adult Si tiene enfermedad de reflujo gastroesofgico (ERGE), los alimentos que consume y los hbitos de alimentacin son muy importantes. Elegir los alimentos adecuados puede ayudar a [employer]. Piense en consultar a un experto en alimentacin (nutricionista) para que lo ayude a Associate Professor. Consejos para seguir Consulting civil engineer las etiquetas de los alimentos Elija alimentos que tengan bajo contenido de grasas saturadas. Los alimentos que pueden ayudar con los sntomas incluyen los siguientes: Alimentos que tienen menos del 5 % de los valores diarios (VD) de grasa. Alimentos que tienen 0 gramos de grasas trans. Al cocinar No frer los alimentos. Cocinar la comida al horno, al vapor, a la plancha o en la parrilla. Estos son mtodos que no necesitan mucha grasa para Water quality scientist. Para agregar sabor, trate de consumir hierbas con bajo contenido de picante y [place]. Planificacin de las comidas  Elegir alimentos saludables con bajo contenido de [place], por ejemplo: Frutas y vegetales. Cereales integrales. Productos lcteos con bajo contenido de [place]. Vita Barley, pescado y aves. Haga comidas pequeas frecuentes en lugar de 3 comidas abundantes al da. Coma lentamente, en un lugar donde est relajado. Evite agacharse o recostarse hasta 2 o 3 horas despus de haber comido. Limite los alimentos con alto contenido graso como las carnes grasas o los alimentos fritos. Limite su ingesta de alimentos grasos, como aceites, [place] y [place]. Evite lo siguiente si el mdico se lo indica: Consumir alimentos que le ocasionen  sntomas. Pueden ser distintos para cada persona. Lleve un registro de los alimentos para identificar aquellos que le causen sntomas. Alcohol. Beber mucha cantidad de lquido con las comidas. Comer 2 o 3 horas antes de acostarse. Estilo de vida Mantenga un peso saludable. Pregntele a su mdico cul es el peso saludable para usted. Si necesita perder peso, hable con su mdico para hacerlo de manera segura. Haga actividad fsica durante, al menos, 30 minutos 5 das por semana o ms, o como se lo haya indicado el mdico. Use ropa suelta. No fume ni consuma ningn producto que contenga nicotina o tabaco. Si necesita ayuda para dejar de fumar, consulte al mdico. Duerma con la cabecera de la cama ms elevada que los pies. Use una cua debajo del colchn o bloques debajo del armazn de la cama para Pharmacologist la cabecera de la cama elevada. Mastique chicle sin azcar despus de las comidas. Qu alimentos debo comer?  Siga una dieta saludable y bien equilibrada que incluya abundantes frutas, verduras, cereales integrales, productos lcteos descremados, carnes magras, pescado y aves. Cada persona es diferente. Los alimentos que pueden causar sntomas en una persona pueden no causar sntomas en otra persona. Trabaje con el mdico para hallar alimentos que sean seguros para usted. Es posible que los productos que se enumeran ms [place] no constituyan una lista completa de lo que puede comer y Product manager. Pngase en contacto con un experto en alimentos para conocer ms opciones. Qu alimentos debo evitar? Limitar algunos de estos alimentos puede ayudar a Chief Operating Officer los sntomas de [place]. Cada persona es diferente. Consulte a un experto en alimentacin o a su mdico para que lo ayude a  encontrar los alimentos exactos que debe evitar, si los hubiere. Frutas Cualquier fruta que est preparada con grasa agregada. Cualquier fruta que le ocasione sntomas. Para algunas personas, estas pueden incluir, las frutas  ctricas como naranja, pomelo, pia y limn. Verduras Verduras fritas en abundante aceite. Papas fritas. Cualquier verdura que est preparada con grasa agregada. Cualquier verdura que le ocasione sntomas. Para algunas personas, estas pueden incluir tomates y productos con tomate, [place], cebollas y [place], y rbanos picantes. Granos Past[place] o panes sin levadura con grasa agregada. Carnes y [address]tenas [address] de alto contenido graso como carne grasa de vaca o cerdo, salchichas, costillas, jamn, salchicha, salame y tocino. Carnes o protenas fritas, lo que incluye pescado frito y pollo frito. Frutos secos y Civil engineer, contracting de frutos secos, en grandes cantidades. Lcteos Leche entera y [place] con chocolate. Tami Ribas. Crema. Helado. Queso crema. Batidos con [employer]. Grasas y [employer]. Margarina. Lardo. Mantequilla clarificada. Bebidas Caf y t negro, con o sin cafena. Bebidas con gas. Refrescos. Bebidas energizantes. Jugo de fruta hecho con frutas cidas, como naranja o pomelo. Jugo de tomate. Bebidas alcohlicas. Dulces y postres Chocolate y cacao. Rosquillas. Alios y condimentos Pimienta. Menta y mentol. Sal agregada. Cualquier condimento, hierbas o aderezos que le ocasionen sntomas. Para algunas personas, esto puede incluir curry, salsa picante o aderezos para ensalada a base de vinagre. Es posible que los productos que se enumeran ms arriba no constituyan una lista completa de lo que no debera comer y Product manager. Pngase en contacto con un experto en alimentos para conocer ms opciones. Preguntas para hacerle al [employer] Los cambios en la dieta y en el estilo de vida a menudo son los primeros pasos que se toman para Company secretary los sntomas de [place]. Si los [employer] dieta y el estilo de vida no ayudan, hable con el mdico sobre el uso de medicamentos. Dnde buscar ms informacin Arts development officer for Gastrointestinal Disorders (Fundacin Internacional para los Trastornos  Gastrointestinales): aboutgerd.org Resumen Si tiene [employer], las elecciones de alimentos y el [place] de vida son muy importantes para ayudar a Consulting civil engineer sntomas. Haga comidas pequeas durante Glass blower/designer de 3 comidas abundantes. Coma lentamente y en un lugar donde est relajado. Evite agacharse o recostarse hasta 2 o 3 horas despus de haber comido. Limite los alimentos con alto contenido graso como las carnes grasas o los alimentos fritos. Esta informacin no tiene Theme park manager el consejo del mdico. Asegrese de hacerle al mdico cualquier pregunta que tenga. Document Revised: 01/14/2020 Document Reviewed: 01/14/2020 Elsevier Patient Education  2023 [employer].

## 2021-11-26 NOTE — Assessment & Plan Note (Signed)
Updated lipid panel ordered.  Will continue atorvastatin at current strength for now.

## 2021-11-26 NOTE — Progress Notes (Signed)
Paul Leon - 41 y.o. male MRN [id number]  Date of birth: 1981-02-02  Subjective No chief complaint on file.   HPI Paul Leon is a 41 y.o. male here today for follow up visit.   Reports that overall he is doing well.  He has been using meloxicam as needed for knee pain.  Reports that this has been helpful.  He is taking omeprazole as well for some mild reflux symptoms.  He has noticed some increased reflux.  He is eating a lot of food just prior to bed.    He has occasionally noted some bright red blood in his stool.  Had colonoscopy in 2018 after having diverticulitis.  He was noted to have hemorrhoids at that time as well.   He has been taking atorvastatin for HLD.  Tolerating this well.  He is due for updated labs.    ROS:  A comprehensive ROS was completed and negative except as noted per HPI  Allergies  Allergen Reactions   Pork Allergy Rash    No past medical history on file.  Past Surgical History:  Procedure Laterality Date   SINOSCOPY     TONSILLECTOMY      Social History   Socioeconomic History   Marital status: Married    Spouse name: Not on file   Number of children: 1   Years of education: Not on file   Highest education level: Not on file  Occupational History   Not on file  Tobacco Use   Smoking status: Never   Smokeless tobacco: Never  Vaping Use   Vaping Use: Never used  Substance and Sexual Activity   Alcohol use: Yes    Alcohol/week: 2.0 - 3.0 standard drinks of alcohol    Types: 2 - 3 Cans of beer per week   Drug use: Never   Sexual activity: Yes    Partners: Female  Other Topics Concern   Not on file  Social History Narrative   Not on file   Social Determinants of Health   Financial Resource Strain: Not on file  Food Insecurity: Not on file  Transportation Needs: Not on file  Physical Activity: Not on file  Stress: Not on file  Social Connections: Not on file    Family History  Problem Relation Age of Onset   Leukemia Maternal  Grandfather    Diabetes Paternal Grandfather     Health Maintenance  Topic Date Due   HIV Screening  Never done   Hepatitis C Screening  Never done   COVID-19 Vaccine (3 - Pfizer series) 11/16/2019   INFLUENZA VACCINE  01/15/2022   TETANUS/TDAP  07/02/2025   HPV VACCINES  Aged Out     ----------------------------------------------------------------------------------------------------------------------------------------------------------------------------------------------------------------- Physical Exam BP 131/80 (BP Location: Left Arm, Patient Position: Sitting, Cuff Size: Large)   Pulse 75   Ht 5\' 5"  (1.651 m)   Wt 231 lb (104.8 kg)   SpO2 97%   BMI 38.44 kg/m   Physical Exam Constitutional:      Appearance: Normal appearance.  Eyes:     General: No scleral icterus. Cardiovascular:     Rate and Rhythm: Normal rate and regular rhythm.  Pulmonary:     Effort: Pulmonary effort is normal.     Breath sounds: Normal breath sounds.  Musculoskeletal:     Cervical back: Neck supple.  Neurological:     General: No focal deficit present.     Mental Status: He is alert.  Psychiatric:  Mood and Affect: Mood normal.        Behavior: Behavior normal.     ------------------------------------------------------------------------------------------------------------------------------------------------------------------------------------------------------------------- Assessment and Plan  GERD (gastroesophageal reflux disease) Reflux is not well controlled.  Discussed dietary modifications.  Will increase omeprazole to BID dosing for a couple of weeks.    Primary osteoarthritis of left knee Meloxicam continues to work pretty well for this.  He is having some foot pain as well.  Recommend to have orthotics previously.  He will plan to do this.    Mixed hyperlipidemia Updated lipid panel ordered.  Will continue atorvastatin at current strength for now.    BRBPR (bright red  blood per rectum) Hemorrhoidal bleeding suspected.  Discussed increasing fiber intake. He will let me know if this becomes more frequent or persistent.    Meds ordered this encounter  Medications   omeprazole (PRILOSEC) 40 MG capsule    Sig: TAKE 1 CAPSULE BY MOUTH EVERY DAY    Dispense:  90 capsule    Refill:  2   atorvastatin (LIPITOR) 10 MG tablet    Sig: Take 1 tablet (10 mg total) by mouth daily.    Dispense:  90 tablet    Refill:  3   meloxicam (MOBIC) 15 MG tablet    Sig: Take 1 tablet (15 mg total) by mouth daily.    Dispense:  45 tablet    Refill:  3    No follow-ups on file.    This visit occurred during the SARS-CoV-2 public health emergency.  Safety protocols were in place, including screening questions prior to the visit, additional usage of staff PPE, and extensive cleaning of exam room while observing appropriate contact time as indicated for disinfecting solutions.

## 2021-11-26 NOTE — Assessment & Plan Note (Signed)
Meloxicam continues to work pretty well for this.  He is having some foot pain as well.  Recommend to have orthotics previously.  He will plan to do this.

## 2021-12-03 ENCOUNTER — Encounter: Payer: BC Managed Care – PPO | Admitting: Family Medicine

## 2021-12-04 ENCOUNTER — Other Ambulatory Visit: Payer: Self-pay | Admitting: Family Medicine

## 2022-01-14 DIAGNOSIS — R1012 Left upper quadrant pain: Secondary | ICD-10-CM | POA: Diagnosis not present

## 2022-01-14 DIAGNOSIS — K625 Hemorrhage of anus and rectum: Secondary | ICD-10-CM | POA: Diagnosis not present

## 2022-01-14 DIAGNOSIS — K219 Gastro-esophageal reflux disease without esophagitis: Secondary | ICD-10-CM | POA: Diagnosis not present

## 2022-01-14 DIAGNOSIS — R194 Change in bowel habit: Secondary | ICD-10-CM | POA: Diagnosis not present

## 2022-01-29 DIAGNOSIS — R1013 Epigastric pain: Secondary | ICD-10-CM | POA: Diagnosis not present

## 2022-01-29 DIAGNOSIS — R1012 Left upper quadrant pain: Secondary | ICD-10-CM | POA: Diagnosis not present

## 2022-01-29 DIAGNOSIS — K3189 Other diseases of stomach and duodenum: Secondary | ICD-10-CM | POA: Diagnosis not present

## 2022-01-29 DIAGNOSIS — K625 Hemorrhage of anus and rectum: Secondary | ICD-10-CM | POA: Diagnosis not present

## 2022-01-29 DIAGNOSIS — K648 Other hemorrhoids: Secondary | ICD-10-CM | POA: Diagnosis not present

## 2022-01-29 DIAGNOSIS — K2970 Gastritis, unspecified, without bleeding: Secondary | ICD-10-CM | POA: Diagnosis not present

## 2022-01-29 DIAGNOSIS — R197 Diarrhea, unspecified: Secondary | ICD-10-CM | POA: Diagnosis not present

## 2022-01-31 DIAGNOSIS — K625 Hemorrhage of anus and rectum: Secondary | ICD-10-CM | POA: Diagnosis not present

## 2022-01-31 DIAGNOSIS — R1013 Epigastric pain: Secondary | ICD-10-CM | POA: Diagnosis not present

## 2022-02-23 ENCOUNTER — Other Ambulatory Visit: Payer: Self-pay | Admitting: Family Medicine

## 2022-03-08 IMAGING — DX DG ANKLE COMPLETE 3+V*R*
3 series · 3 of 3 positions shown · non-contrast
Comparison: None.

CLINICAL DATA: Pain

EXAM:
RIGHT ANKLE - COMPLETE 3+ VIEW

[ankle ap]
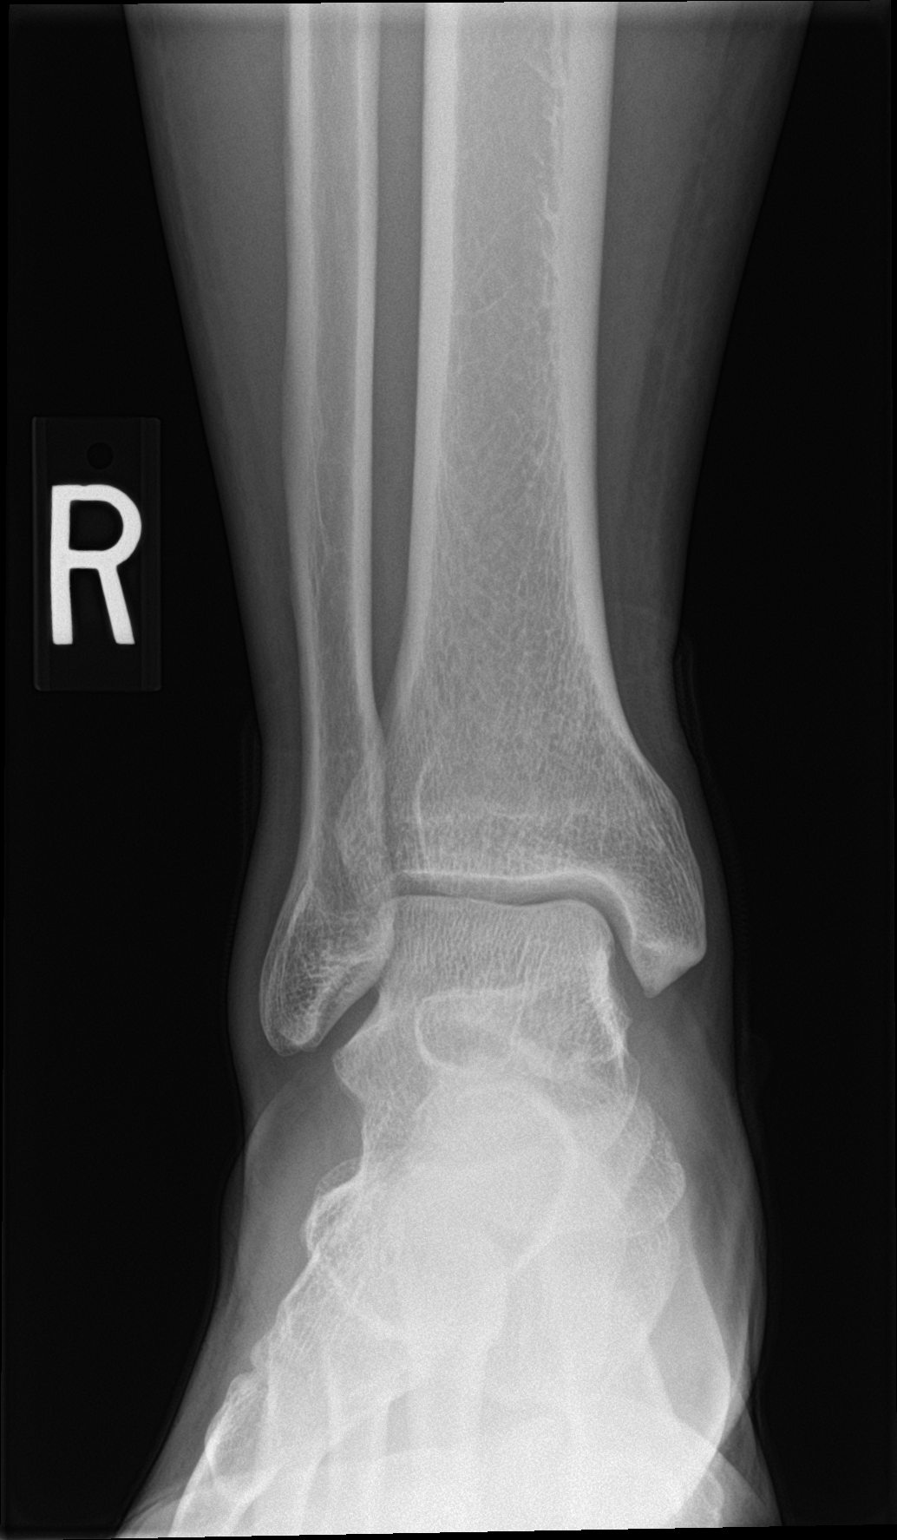

[ankle obl]
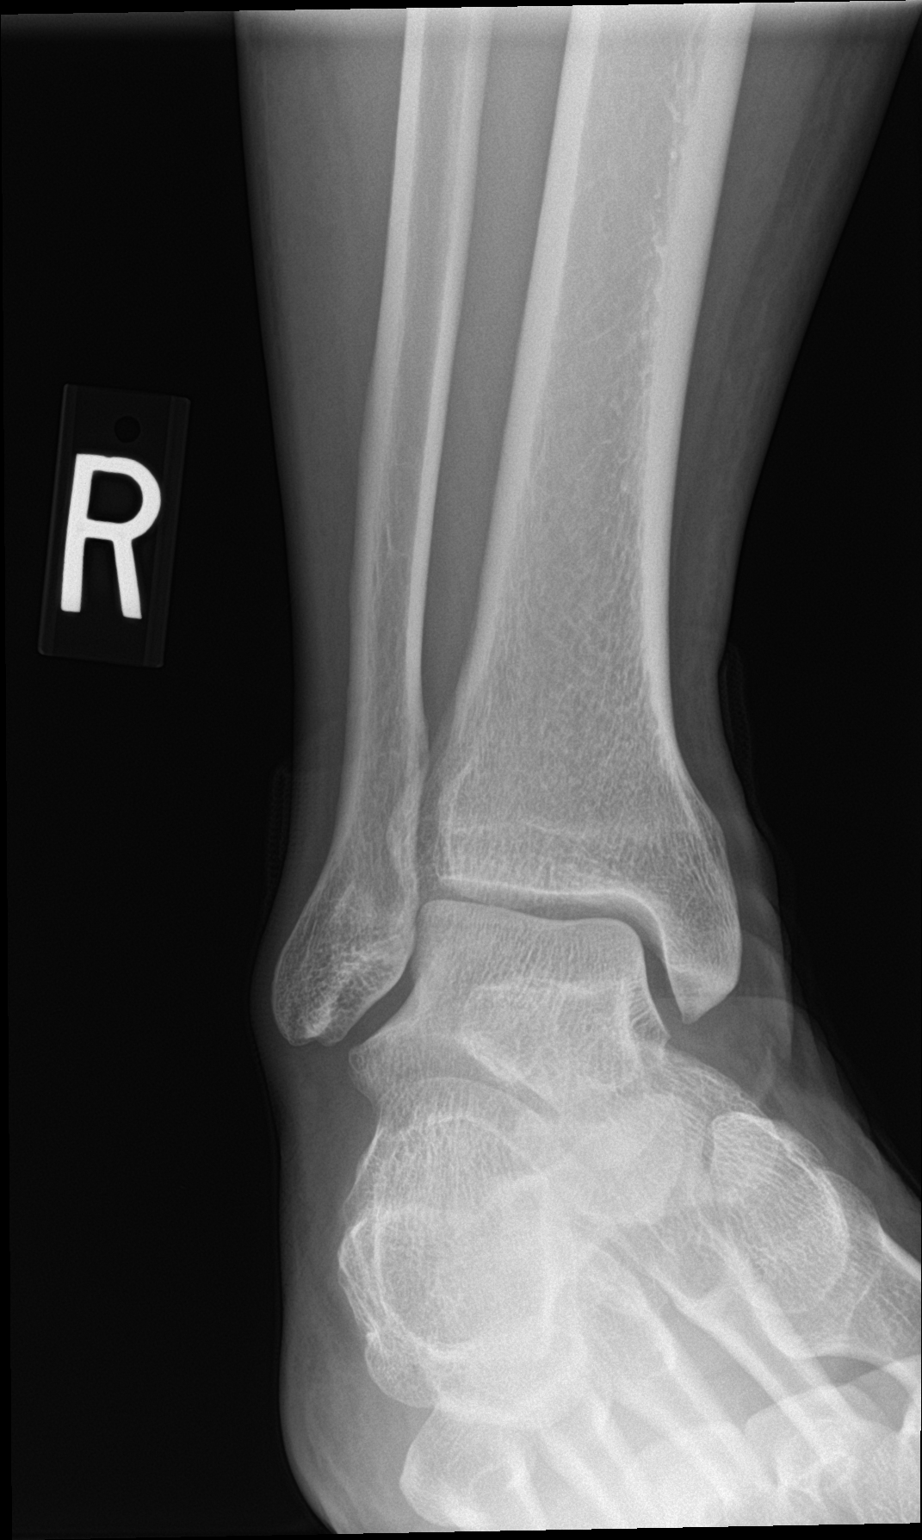

[ankle lat]
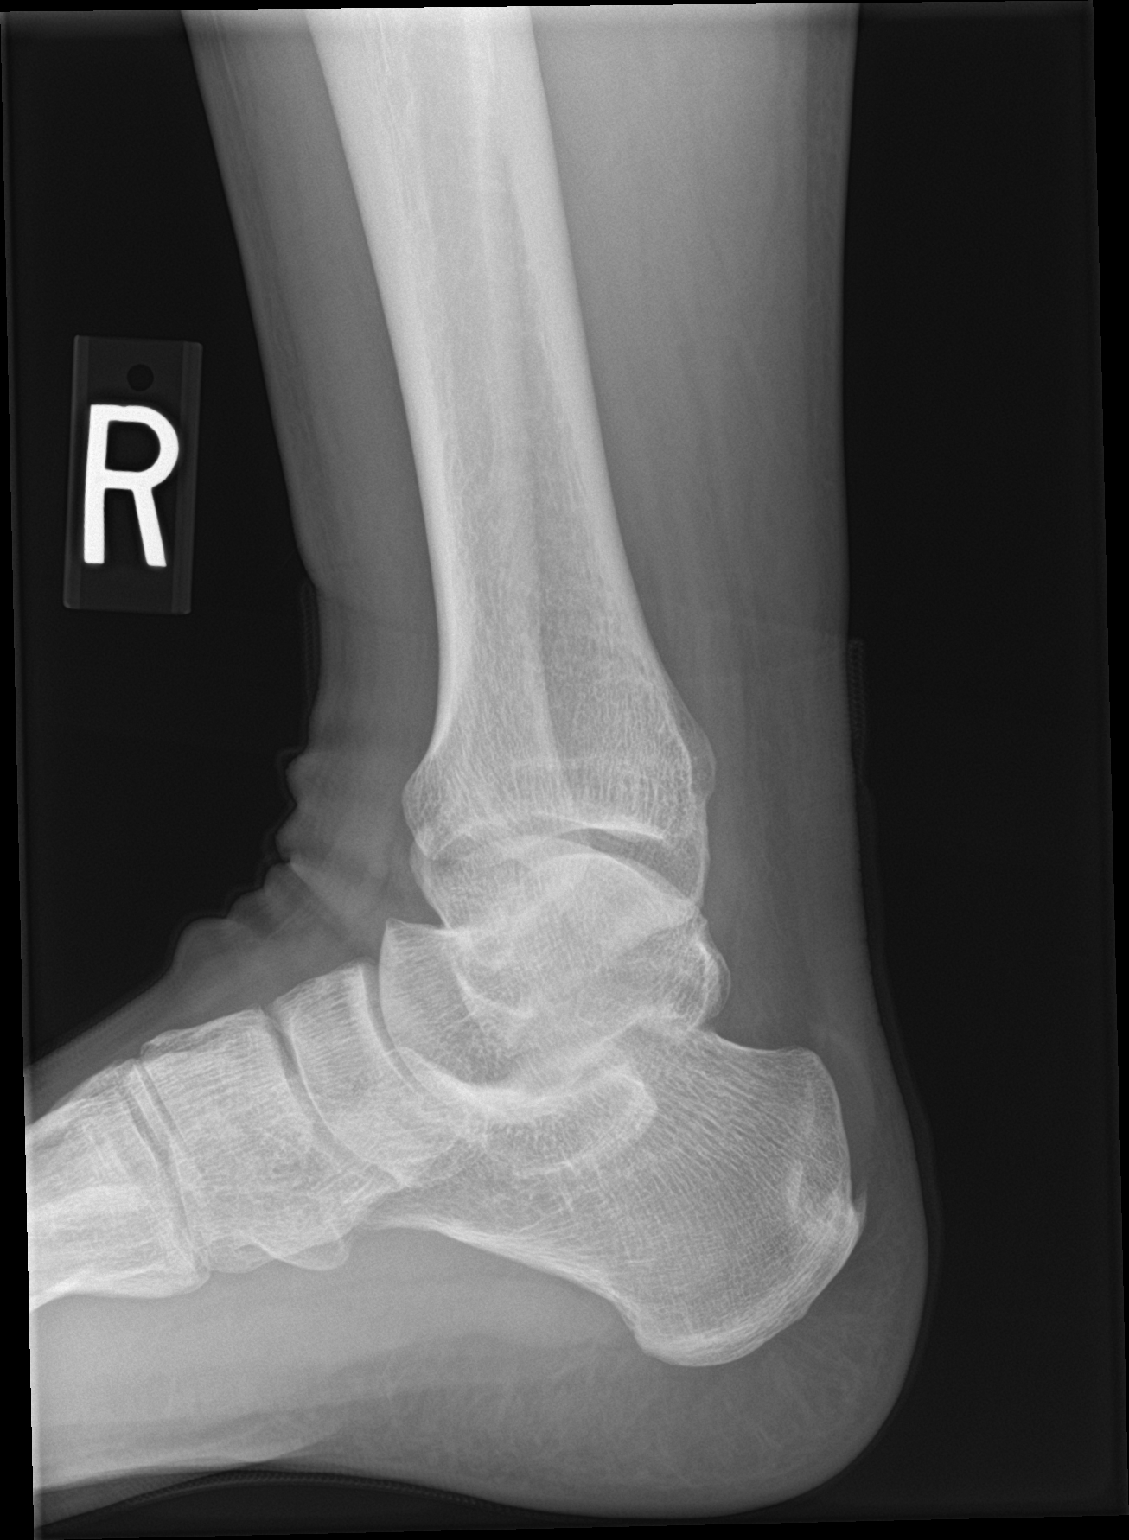

[3 of 3 positions shown; findings below may reference images not displayed]

FINDINGS: No acute fracture or dislocation. Joint spaces and alignment are
maintained. Tiny enthesophyte of the Achilles tendon. No area of
erosion or osseous destruction. No unexpected radiopaque foreign
body. Soft tissues are unremarkable.
IMPRESSION: Negative.

## 2022-04-22 DIAGNOSIS — R200 Anesthesia of skin: Secondary | ICD-10-CM | POA: Diagnosis not present

## 2022-04-22 DIAGNOSIS — R0789 Other chest pain: Secondary | ICD-10-CM | POA: Diagnosis not present

## 2022-04-22 DIAGNOSIS — R079 Chest pain, unspecified: Secondary | ICD-10-CM | POA: Diagnosis not present

## 2022-04-22 DIAGNOSIS — R202 Paresthesia of skin: Secondary | ICD-10-CM | POA: Diagnosis not present

## 2022-04-22 DIAGNOSIS — Z791 Long term (current) use of non-steroidal anti-inflammatories (NSAID): Secondary | ICD-10-CM | POA: Diagnosis not present

## 2022-04-22 DIAGNOSIS — Z91013 Allergy to seafood: Secondary | ICD-10-CM | POA: Diagnosis not present

## 2022-04-28 ENCOUNTER — Other Ambulatory Visit: Payer: Self-pay | Admitting: Family Medicine

## 2022-05-28 ENCOUNTER — Encounter: Payer: Self-pay | Admitting: Family Medicine

## 2022-05-28 ENCOUNTER — Ambulatory Visit (INDEPENDENT_AMBULATORY_CARE_PROVIDER_SITE_OTHER): Payer: BC Managed Care – PPO | Admitting: Family Medicine

## 2022-05-28 VITALS — BP 134/80 | HR 81 | Ht 65.0 in | Wt 232.0 lb

## 2022-05-28 DIAGNOSIS — E782 Mixed hyperlipidemia: Secondary | ICD-10-CM

## 2022-05-28 DIAGNOSIS — R030 Elevated blood-pressure reading, without diagnosis of hypertension: Secondary | ICD-10-CM | POA: Diagnosis not present

## 2022-05-28 NOTE — Assessment & Plan Note (Signed)
He is doing well with atorvastatin at current strength.  Recommend continuation.  Updating lipid panel and hepatic function

## 2022-05-28 NOTE — Patient Instructions (Signed)
Controle su hipertensin La persona con hipertensin podr aprender qu es la presin arterial, qu significan los nmeros y qu puede hacer para Pharmacologist la presin arterial en un valor normal.  To view the content, go to this web address: [email or web address]  This video will expire on: 02/20/2024. If you need access to this video following this date, please reach out to the healthcare provider who assigned it to you. This information is not intended to replace advice given to you by your health care provider. Make sure you discuss any questions you have with your health care provider. Elsevier Patient Education  2023 Elsevier Inc.   Prevencin de la hipertensin Preventing Hypertension La hipertensin, tambin conocida como presin arterial alta, se produce cuando la sangre bombea en las arterias con demasiada fuerza. Las arterias son vasos sanguneos que transportan la sangre desde el corazn al resto del cuerpo. Con frecuencia, la hipertensin no causa sntomas hasta que la presin arterial es muy alta. Es importante que controle regularmente su presin arterial. Los cambios en la dieta y el estilo de vida pueden ayudar a prevenir la hipertensin y a Estate agent mejor al mejorar su calidad de vida. Si ya tiene hipertensin, puede controlarla con cambios en la dieta y el estilo de vida y con medicamentos. Cmo puede afectarme esta enfermedad? Con el transcurso del [place], la hipertensin puede daar las arterias y Engineer, manufacturing systems flujo de sangre hacia partes importantes del cuerpo que incluyen el cerebro, el corazn y los riones. Si mantiene su presin arterial en un nivel saludable, podr prevenir complicaciones como un infarto de miocardio, insuficiencia cardaca, un accidente cerebrovascular, insuficiencia renal y demencia vascular. Qu puede aumentar el riesgo? Una alimentacin poco saludable y la falta de actividad fsica pueden aumentar las probabilidades de tener presin  arterial alta. Algunos otros factores de riesgo son los siguientes: Edad. El riesgo aumenta con la edad. Tener familiares que han tenido presin arterial alta. Tener ciertas afecciones, como problemas de tiroides. Tener sobrepeso u obesidad. Consumir cafena o alcohol en exceso. Consumir mucha grasa, azcar, caloras o sal (sodio) en su dieta. Fumar o consumir drogas ilegales. Tomar ciertos medicamentos, como antidepresivos, descongestivos, pldoras anticonceptivas y antiinflamatorios no esteroideos (AINE), como el ibuprofeno. Qu medidas puedo tomar para prevenir o controlar esta afeccin? Trabaje junto al mdico para desarrollar un plan de prevencin de la hipertensin que funcione para usted. Es posible que lo deriven para que reciba asesoramiento sobre una dieta saludable y Doroteo Glassman fsica. Siga su plan y Joelyn Oms a todas las visitas de seguimiento. Cambios en la dieta Siga una dieta saludable. Esto puede comprender lo siguiente: Menor ingesta de sal (sodio). Pregntele al mdico cunto sodio puede consumir de forma segura. La recomendacin general es consumir menos de 1 cucharadita (2300 mg) de sodio por da. No agregue sal a las comidas. Opte por alimentos con bajo contenido de sodio cuando realice las compras o coma fuera de casa. Limite la cantidad de grasa en la dieta. Esto se puede lograr con [employer] o de bajo contenido de grasas e ingiriendo menor cantidad de carnes rojas. Coma ms frutas, verduras y cereales integrales. Establezca un objetivo para comer: 1 a 2 tazas de frutas y verduras frescas todos los [address]  [address]. 3 a 4 porciones de cereales [employer]. Evite los alimentos y las bebidas que tengan azcares agregados. Coma pescados que contengan grasas saludables (cidos grasos omega-3), como la caballa o el salmn. Si necesita implementar un plan de comidas saludable, pruebe la dieta DASH. Esta  dieta tiene un alto contenido de frutas, verduras y Financial risk analyst. Incluye poca cantidad de sodio, carnes rojas y azcares agregados. DASH es la sigla en ingls de "Enfoques Alimentarios para Detener la Hipertensin". Cambios en el estilo de vida  Baje de peso si es necesario. Con tan solo bajar entre el 3 % y el 5 % del peso corporal, puede prevenir o Chief Operating Officer la hipertensin. Por ejemplo, si su peso actual es de 200 libras (91 kg), una prdida entre el 3 % y el 5 % de su peso significa perder entre 6 y 10 libras (2.7 a 4.5 kg). Pdale al mdico que le recomiende una dieta y un plan de ejercicios para bajar de peso de forma segura. Ejerctate lo suficiente. Debe realizar al menos 150 minutos de ejercicios de intensidad moderada todas las semanas. Puede realizar [employer] en sesiones cortas de ejercicios, varias veces al da, o puede realizar sesiones ms largas, pero menos veces por semana. Por ejemplo, puede realizar una caminata enrgica o andar en bicicleta durante 10 minutos, 3 veces al da, durante 5 das a la [place]. Encuentre maneras de reducir el estrs, como hacer ejercicios, Primary school teacher, Optometrist o tomar una clase de yoga. Si necesita ayuda para reducir [employer] de estrs, consulte al mdico. No consuma ningn producto que contenga nicotina o tabaco. Estos productos incluyen cigarrillos, tabaco para Theatre manager y aparatos de vapeo, como los Administrator, Civil Service. Las sustancias qumicas presentes en los productos con tabaco y nicotina elevan su presin arterial cada vez que los consume. Si necesita ayuda para dejar de consumir estos productos, consulte al mdico. Aprenda a medir su presin arterial en casa. Asegrese de Solicitor su objetivo de presin arterial, como se lo haya indicado el mdico. Trate de dormir entre 7 y 9 horas todas las noches. Consumo de alcohol No beba alcohol si: Su mdico le indica no hacerlo. Est embarazada, puede estar embarazada o est tratando de [place]. Si bebe alcohol: Limite la cantidad que bebe a lo  siguiente: De 0 a 1 medida por da para las mujeres. De 0 a 2 medidas por da para los hombres. Sepa cunta cantidad de alcohol hay en las bebidas que toma. En los [address], una medida equivale a una botella de cerveza de 12 oz (355 ml), un vaso de vino de 5 oz (148 ml) o un vaso de una bebida alcohlica de alta graduacin de 1 oz (44 ml). Medicamentos Adems de los cambios en la dieta y el estilo de vida, [place] mdico podr indicarle medicamentos para ayudarle a Publishing copy su presin arterial. En general: Tal vez deba probar distintos medicamentos hasta encontrar el ms adecuado para usted. Quiz necesite tomar ms de un medicamento. Use los medicamentos de venta libre y los recetados solamente como se lo haya indicado el mdico. Preguntas para hacerle al mdico Cul es mi presin arterial ideal? Cmo disminuyo mi riesgo de tener presin arterial alta? Cmo debo controlar mi presin arterial en casa? Dnde obtener apoyo Su mdico puede ayudarle a prevenir la hipertensin y Pharmacologist su presin arterial en un nivel saludable. Su hospital o comunidad locales tambin pueden proporcionarle servicios y programas de prevencin. La American Heart Association (Asociacin Estadounidense del [place]) ofrece un red de ayuda en lnea en supportnetwork.heart.org Dnde obtener ms informacin Obtenga ms informacin sobre la hipertensin en: Armed forces training and education officer, Lung, and Blood Institute (Instituto Nacional del [place], los Pulmones y Risk manager): [email or web address].is Centers for Disease Control and Prevention (Centros para Air traffic controller y Psychiatrist  de [place]): [email or web address].br American Academy of Family Physicians (Academia Estadounidense de Mdicos de [place]): familydoctor.org Obtenga ms informacin sobre la dieta DASH en: [employer], Lung, and Blood Institute (Instituto [employer] del [place], los Pulmones y Risk manager): [email or web address].is Comunquese con un mdico si: Piensa que tiene una reaccin  alrgica a los medicamentos que ha tomado. Tiene mareos o dolores de cabeza con Naval architect. Tiene hinchazn en los tobillos. Tiene problemas de visin. Solicite ayuda de inmediato si: Tiene un dolor o Dentist repentino o intenso en el pecho, la espalda o el abdomen. Le falta el aire. Tienes un dolor de cabeza repentino e intenso. Estos sntomas pueden Customer service manager. Solicite ayuda de inmediato. Llame al 911. No espere a ver si los sntomas desaparecen. No conduzca por sus propios medios [employer] [facility]. Resumen La hipertensin con frecuencia no provoca sntomas hasta que la presin arterial es muy alta. Es importante que controle regularmente su presin arterial. Los cambios en la dieta y el estilo de vida son pasos importantes para prevenir la hipertensin. Si mantiene su presin arterial en un nivel saludable, podr prevenir complicaciones como un infarto de miocardio, insuficiencia cardaca, un accidente cerebrovascular e insuficiencia renal. Trabaje junto al mdico para desarrollar un plan de prevencin de la hipertensin que funcione para usted. Esta informacin no tiene Theme park manager el consejo del mdico. Asegrese de hacerle al mdico cualquier pregunta que tenga. Document Revised: 04/18/2021 Document Reviewed: 04/18/2021 Elsevier Patient Education  2023 [employer].

## 2022-05-28 NOTE — Progress Notes (Signed)
Paul Leon - 41 y.o. male MRN [id number]  Date of birth: 19-Nov-1980  Subjective Chief Complaint  Patient presents with   Hypertension    HPI Paul Leon is a 41 year old male here today for follow-up visit.  Since his last visit he was seen in the ED with complaint of chest pain.  Ruled out for ACS however he was noted to have elevated blood pressure.  His blood pressure is elevated initially today.  He has not had any further chest pain, dyspnea, headache or vision changes.  He does admit that diet could be better.  He does eat a lot of fast food while working.  Continues on atorvastatin for management of hyperlipidemia.  Due for updated labs.  He is tolerating atorvastatin well at current strength.  ROS:  A comprehensive ROS was completed and negative except as noted per HPI  Allergies  Allergen Reactions   Pork Allergy Rash    History reviewed. No pertinent past medical history.  Past Surgical History:  Procedure Laterality Date   SINOSCOPY     TONSILLECTOMY      Social History   Socioeconomic History   Marital status: Married    Spouse name: Not on file   Number of children: 1   Years of education: Not on file   Highest education level: Not on file  Occupational History   Not on file  Tobacco Use   Smoking status: Never   Smokeless tobacco: Never  Vaping Use   Vaping Use: Never used  Substance and Sexual Activity   Alcohol use: Yes    Alcohol/week: 2.0 - 3.0 standard drinks of alcohol    Types: 2 - 3 Cans of beer per week   Drug use: Never   Sexual activity: Yes    Partners: Female  Other Topics Concern   Not on file  Social History Narrative   Not on file   Social Determinants of Health   Financial Resource Strain: Not on file  Food Insecurity: Not on file  Transportation Needs: Not on file  Physical Activity: Not on file  Stress: Not on file  Social Connections: Not on file    Family History  Problem Relation Age of Onset   Leukemia  Maternal Grandfather    Diabetes Paternal Grandfather     Health Maintenance  Topic Date Due   COVID-19 Vaccine (3 - 2023-24 season) 08/16/2022 (Originally 02/15/2022)   INFLUENZA VACCINE  09/15/2022 (Originally 01/15/2022)   Hepatitis C Screening  11/27/2022 (Originally 11/19/1997)   HIV Screening  11/27/2022 (Originally 11/20/1994)   DTaP/Tdap/Td (2 - Tdap) 07/02/2025   HPV VACCINES  Aged Out     ----------------------------------------------------------------------------------------------------------------------------------------------------------------------------------------------------------------- Physical Exam BP 134/80 (BP Location: Left Arm, Patient Position: Sitting, Cuff Size: Large)   Pulse 81   Ht 5\' 5"  (1.651 m)   Wt 232 lb (105.2 kg)   SpO2 97%   BMI 38.61 kg/m   Physical Exam Constitutional:      Appearance: Normal appearance.  HENT:     Head: Normocephalic and atraumatic.  Eyes:     General: No scleral icterus. Cardiovascular:     Rate and Rhythm: Normal rate and regular rhythm.  Pulmonary:     Effort: Pulmonary effort is normal.     Breath sounds: Normal breath sounds.  Musculoskeletal:     Cervical back: Neck supple.  Neurological:     Mental Status: He is alert.  Psychiatric:        Mood and Affect: Mood normal.  Behavior: Behavior normal.     ------------------------------------------------------------------------------------------------------------------------------------------------------------------------------------------------------------------- Assessment and Plan  Mixed hyperlipidemia He is doing well with atorvastatin at current strength.  Recommend continuation.  Updating lipid panel and hepatic function  Elevated blood pressure reading in office without diagnosis of hypertension Initial blood pressure elevated today.  Repeat reading is better.  Given information regarding controlling blood pressure.  Follow-up in about 6 weeks for  nurse visit for repeat blood pressure.  Updated labs ordered.   No orders of the defined types were placed in this encounter.   Return in about 6 weeks (around 07/09/2022) for Nurse visit-BP check.    This visit occurred during the SARS-CoV-2 public health emergency.  Safety protocols were in place, including screening questions prior to the visit, additional usage of staff PPE, and extensive cleaning of exam room while observing appropriate contact time as indicated for disinfecting solutions.

## 2022-05-28 NOTE — Assessment & Plan Note (Signed)
Initial blood pressure elevated today.  Repeat reading is better.  Given information regarding controlling blood pressure.  Follow-up in about 6 weeks for nurse visit for repeat blood pressure.  Updated labs ordered.

## 2022-06-24 ENCOUNTER — Other Ambulatory Visit: Payer: Self-pay | Admitting: Family Medicine

## 2022-07-05 ENCOUNTER — Other Ambulatory Visit: Payer: Self-pay | Admitting: Family Medicine

## 2022-07-09 ENCOUNTER — Ambulatory Visit: Payer: BC Managed Care – PPO

## 2022-07-20 ENCOUNTER — Other Ambulatory Visit: Payer: Self-pay | Admitting: Family Medicine

## 2022-08-20 ENCOUNTER — Other Ambulatory Visit: Payer: Self-pay | Admitting: Family Medicine

## 2022-08-23 ENCOUNTER — Other Ambulatory Visit: Payer: Self-pay

## 2022-08-23 MED ORDER — OMEPRAZOLE 40 MG PO CPDR: DELAYED_RELEASE_CAPSULE | ORAL | 0 refills | Status: DC

## 2022-09-30 ENCOUNTER — Encounter: Payer: Self-pay | Admitting: *Deleted

## 2022-10-07 ENCOUNTER — Other Ambulatory Visit: Payer: Self-pay | Admitting: Family Medicine

## 2022-11-19 ENCOUNTER — Other Ambulatory Visit: Payer: Self-pay | Admitting: Family Medicine

## 2022-11-27 ENCOUNTER — Other Ambulatory Visit: Payer: Self-pay | Admitting: Family Medicine

## 2022-12-23 ENCOUNTER — Encounter: Payer: Self-pay | Admitting: Family Medicine

## 2022-12-23 ENCOUNTER — Ambulatory Visit (INDEPENDENT_AMBULATORY_CARE_PROVIDER_SITE_OTHER): Payer: BC Managed Care – PPO | Admitting: Family Medicine

## 2022-12-23 VITALS — BP 127/75 | HR 98 | Temp 99.1°F | Resp 18 | Ht 65.0 in | Wt 235.7 lb

## 2022-12-23 DIAGNOSIS — J069 Acute upper respiratory infection, unspecified: Secondary | ICD-10-CM | POA: Diagnosis not present

## 2022-12-23 DIAGNOSIS — U071 COVID-19: Secondary | ICD-10-CM | POA: Diagnosis not present

## 2022-12-23 LAB — POC COVID19 BINAXNOW: SARS Coronavirus 2 Ag: POSITIVE — AB

## 2022-12-23 MED ORDER — PROMETHAZINE-DM 6.25-15 MG/5ML PO SYRP: 5.0000 mL | ORAL_SOLUTION | Freq: Every evening | ORAL | 0 refills | Status: DC | PRN

## 2022-12-23 NOTE — Progress Notes (Signed)
Acute Office Visit  Subjective:     Patient ID: Paul Leon, male    DOB: Jun 26, 1980, 42 y.o.   MRN: [id number]  Chief Complaint  Patient presents with   URI    Patient states he has had headaches, congestion, no appetite, hot and cold chills, and low grade fever. Patient states that he took an at home covid test and states the results were incluclusive    HPI Patient is in today for acute visit. Pt is new to me.  He reports yesterday he's had  body aches, sinus pressure, and chills. Not checking temperature at home. Feels like he's hot with cold chills. He has had a cough that's productive and is green. No SOB. Denies sore throat or ear pain. Had sick contacts last week.  Wife reports they took home covid test this morning and was 'inconclusive'.   Patient Active Problem List   Diagnosis Date Noted   Elevated blood pressure reading in office without diagnosis of hypertension 05/28/2022   BRBPR (bright red blood per rectum) 11/26/2021   Acute bilateral ankle pain 10/25/2020   Mixed hyperlipidemia 10/25/2020   Primary osteoarthritis of left knee 03/27/2020   GERD (gastroesophageal reflux disease) 03/20/2020   Pain in both lower extremities 02/07/2020   Mass of right wrist 02/07/2020   Elbow stiffness, left 06/23/2019      Review of Systems  Constitutional:  Positive for chills, fever and malaise/fatigue.  Respiratory:  Positive for cough and sputum production. Negative for shortness of breath.   Cardiovascular:  Negative for chest pain.  Musculoskeletal:  Positive for myalgias.  All other systems reviewed and are negative.       Objective:    BP 127/75   Pulse 98   Temp 99.1 F (37.3 C) (Oral)   Resp 18   Ht 5\' 5"  (1.651 m)   Wt 235 lb 11.2 oz (106.9 kg)   SpO2 97%   BMI 39.22 kg/m    Physical Exam Vitals and nursing note reviewed.  Constitutional:      Appearance: Normal appearance. He is obese.  HENT:     Head: Normocephalic and atraumatic.     Right  Ear: Tympanic membrane, ear canal and external ear normal.     Left Ear: Tympanic membrane, ear canal and external ear normal.     Nose: Nose normal.     Mouth/Throat:     Mouth: Mucous membranes are moist.  Eyes:     Pupils: Pupils are equal, round, and reactive to light.  Cardiovascular:     Rate and Rhythm: Normal rate and regular rhythm.     Pulses: Normal pulses.     Heart sounds: Normal heart sounds.  Pulmonary:     Effort: Pulmonary effort is normal.     Breath sounds: Normal breath sounds.  Skin:    General: Skin is warm.     Capillary Refill: Capillary refill takes less than 2 seconds.  Neurological:     General: No focal deficit present.     Mental Status: He is alert and oriented to person, place, and time. Mental status is at baseline.  Psychiatric:        Mood and Affect: Mood normal.        Behavior: Behavior normal.        Thought Content: Thought content normal.        Judgment: Judgment normal.    Results for orders placed or performed in visit on 12/23/22  POC COVID-19  BinaxNow  Result Value Ref Range   SARS Coronavirus 2 Ag Positive (A) Negative        Assessment & Plan:   Problem List Items Addressed This Visit   None Visit Diagnoses     Upper respiratory tract infection, unspecified type    -  Primary   Relevant Medications   promethazine-dextromethorphan (PROMETHAZINE-DM) 6.25-15 MG/5ML syrup   Other Relevant Orders   POC COVID-19 BinaxNow (Completed)   COVID-19       Relevant Medications   promethazine-dextromethorphan (PROMETHAZINE-DM) 6.25-15 MG/5ML syrup   Other Relevant Orders   Basic Metabolic Panel (BMET)      Upper respiratory tract infection, unspecified type -     POC COVID-19 BinaxNow -     Promethazine-DM; Take 5 mLs by mouth at bedtime as needed for cough.  Dispense: 118 mL; Refill: 0  COVID-19 -     Basic metabolic panel -     Promethazine-DM; Take 5 mLs by mouth at bedtime as needed for cough.  Dispense: 118 mL; Refill:  0    Meds ordered this encounter  Medications   promethazine-dextromethorphan (PROMETHAZINE-DM) 6.25-15 MG/5ML syrup    Sig: Take 5 mLs by mouth at bedtime as needed for cough.    Dispense:  118 mL    Refill:  0  Covid +. Needs GFR before prescribing Paxlovid. Pt and wife agree with awaiting test results before can be sent in.  To do tylenol prn for fevers and discomfort. Will go ahead and send in medicine for cough.  No follow-ups on file.  Suzan Slick, MD

## 2022-12-24 ENCOUNTER — Other Ambulatory Visit: Payer: Self-pay | Admitting: Family Medicine

## 2022-12-24 ENCOUNTER — Ambulatory Visit: Payer: BC Managed Care – PPO | Admitting: Family Medicine

## 2022-12-24 DIAGNOSIS — U071 COVID-19: Secondary | ICD-10-CM

## 2022-12-24 LAB — BASIC METABOLIC PANEL
BUN/Creatinine Ratio: 10 (ref 9–20)
BUN: 8 mg/dL (ref 6–24)
CO2: 24 mmol/L (ref 20–29)
Calcium: 9.4 mg/dL (ref 8.7–10.2)
Chloride: 100 mmol/L (ref 96–106)
Creatinine, Ser: 0.83 mg/dL (ref 0.76–1.27)
Glucose: 112 mg/dL — ABNORMAL HIGH (ref 70–99)
Potassium: 4 mmol/L (ref 3.5–5.2)
Sodium: 140 mmol/L (ref 134–144)
eGFR: 111 mL/min/{1.73_m2} (ref >59)

## 2022-12-24 LAB — SPECIMEN STATUS REPORT

## 2022-12-24 MED ORDER — NIRMATRELVIR/RITONAVIR (PAXLOVID)TABLET: 3.0000 | ORAL_TABLET | Freq: Two times a day (BID) | ORAL | 0 refills | Status: AC

## 2022-12-31 ENCOUNTER — Other Ambulatory Visit: Payer: Self-pay | Admitting: Family Medicine

## 2023-03-17 ENCOUNTER — Telehealth: Payer: Self-pay | Admitting: General Practice

## 2023-03-17 NOTE — Transitions of Care (Post Inpatient/ED Visit) (Signed)
   03/17/2023  Name: Paul Leon MRN: [id number] DOB: 27-Oct-1980  Today's TOC FU Call Status: Today's TOC FU Call Status:: Successful TOC FU Call Completed TOC FU Call Complete Date: 03/17/23 Patient's Name and Date of Birth confirmed.  Transition Care Management Follow-up Telephone Call Date of Discharge: 03/15/23 Discharge Facility: Other M[facility]) Name of Other (Non[facility]) Discharge Facility: [facility] Type of Discharge: Emergency Department Reason for ED Visit: Cardiac Conditions Cardiac Conditions Diagnosis: Chest Pain Persisting How have you been since you were released from the hospital?: Better Any questions or concerns?: No  Items Reviewed: Did you receive and understand the discharge instructions provided?: Yes Medications obtained,verified, and reconciled?: Yes (Medications Reviewed) Any new allergies since your discharge?: No Dietary orders reviewed?: NA Do you have support at home?: Yes  Medications Reviewed Today: Medications Reviewed Today     Reviewed by Modesto Charon, RN (Registered Nurse) on 03/17/23 at 1516  Med List Status: <None>   Medication Order Taking? Sig Documenting Provider Last Dose Status Informant  atorvastatin (LIPITOR) 10 MG tablet [id number] No Take 1 tablet (10 mg total) by mouth daily.  Patient not taking: Reported on 12/23/2022   Everrett Coombe, DO Not Taking Active   meloxicam (MOBIC) 15 MG tablet [id number] No Take 1 tablet (15 mg total) by mouth daily. NO REFILLS. OVERDUE FOR AN APPT.  Patient not taking: Reported on 12/23/2022   Everrett Coombe, DO Not Taking Active   omeprazole (PRILOSEC) 40 MG capsule [id number]  TAKE 1 CAPSULE BY MOUTH EVERY DAY Everrett Coombe, DO  Active   promethazine-dextromethorphan (PROMETHAZINE-DM) 6.25-15 MG/5ML syrup [id number]  Take 5 mLs by mouth at bedtime as needed for cough. Suzan Slick, MD  Active             Home Care and Equipment/Supplies: Were Home Health Services Ordered?: NA Any new  equipment or medical supplies ordered?: NA  Functional Questionnaire: Do you need assistance with bathing/showering or dressing?: No Do you need assistance with meal preparation?: No Do you need assistance with eating?: No Do you have difficulty maintaining continence: No Do you need assistance with getting out of bed/getting out of a chair/moving?: No Do you have difficulty managing or taking your medications?: No  Follow up appointments reviewed: PCP Follow-up appointment confirmed?: Yes Date of PCP follow-up appointment?: 03/20/23 Follow-up Provider: Dr. Ashley Royalty Specialist [facility] Follow-up appointment confirmed?: NA Do you need transportation to your follow-up appointment?: No Do you understand care options if your condition(s) worsen?: Yes-patient verbalized understanding  SDOH Interventions Today    Flowsheet Row Most Recent Value  SDOH Interventions   Transportation Interventions Intervention Not Indicated       SIGNATURE Modesto Charon, RN BSN Nurse Health Advisor

## 2023-03-20 ENCOUNTER — Ambulatory Visit (INDEPENDENT_AMBULATORY_CARE_PROVIDER_SITE_OTHER): Payer: No Typology Code available for payment source | Admitting: Family Medicine

## 2023-03-20 ENCOUNTER — Encounter: Payer: Self-pay | Admitting: Family Medicine

## 2023-03-20 VITALS — BP 123/79 | HR 70 | Ht 65.0 in | Wt 238.0 lb

## 2023-03-20 DIAGNOSIS — R079 Chest pain, unspecified: Secondary | ICD-10-CM | POA: Diagnosis not present

## 2023-03-20 MED ORDER — PANTOPRAZOLE SODIUM 40 MG PO TBEC: 40.0000 mg | DELAYED_RELEASE_TABLET | Freq: Every day | ORAL | 0 refills | Status: DC

## 2023-03-20 NOTE — Assessment & Plan Note (Signed)
Epigastric and chest pain.  Improved with GI cocktail now on protonix.  Discussed making changes to diet and lifestyle.  Will continue protonix and f/u in 3 months.

## 2023-03-20 NOTE — Progress Notes (Signed)
Paul Leon - 42 y.o. male MRN [id number]  Date of birth: 12-24-1980  Subjective Chief Complaint  Patient presents with   Hospitalization Follow-up   Low Platelets    HPI Paul Leon is a 42 y.o. male here today for hospital follow up.  Seen recently with complaint of chest pain.  Ruled out for ACS.  Symptoms improved with GI cocktail.  Reports that he is feeling ok today.  His hemoglobin was slightly low at 13.3.   He has found that fried foods, spicy foods and EtOH seem to cause more symptoms.  Denies nausea or vomiting.  He was discharged with pantoprazole and has not had further symptoms.   ROS:  A comprehensive ROS was completed and negative except as noted per HPI  Allergies  Allergen Reactions   Pork Allergy Rash    No past medical history on file.  Past Surgical History:  Procedure Laterality Date   SINOSCOPY     TONSILLECTOMY      Social History   Socioeconomic History   Marital status: Married    Spouse name: Not on file   Number of children: 1   Years of education: Not on file   Highest education level: Not on file  Occupational History   Not on file  Tobacco Use   Smoking status: Never   Smokeless tobacco: Never  Vaping Use   Vaping status: Never Used  Substance and Sexual Activity   Alcohol use: Yes    Alcohol/week: 2.0 - 3.0 standard drinks of alcohol    Types: 2 - 3 Cans of beer per week   Drug use: Never   Sexual activity: Yes    Partners: Female  Other Topics Concern   Not on file  Social History Narrative   Not on file   Social Determinants of Health   Financial Resource Strain: Not on file  Food Insecurity: No Food Insecurity (04/24/2021)   Received from [facility], [facility]   Hunger Vital Sign    Worried About Running Out of Food in the Last Year: Never true    Ran Out of Food in the Last Year: Never true  Transportation Needs: No Transportation Needs (03/17/2023)   PRAPARE - Administrator, Civil Service (Medical):  No    Lack of Transportation (Non-Medical): No  Physical Activity: Not on file  Stress: Not on file  Social Connections: Unknown (10/25/2021)   Received from [facility], [facility]   Social Network    Social Network: Not on file    Family History  Problem Relation Age of Onset   Leukemia Maternal Grandfather    Diabetes Paternal Grandfather     Health Maintenance  Topic Date Due   COVID-19 Vaccine (3 - 2023-24 season) 04/05/2023 (Originally 02/16/2023)   INFLUENZA VACCINE  09/15/2023 (Originally 01/16/2023)   Hepatitis C Screening  03/19/2024 (Originally 11/19/1997)   HIV Screening  03/19/2024 (Originally 11/20/1994)   DTaP/Tdap/Td (2 - Tdap) 07/02/2025   HPV VACCINES  Aged Out     ----------------------------------------------------------------------------------------------------------------------------------------------------------------------------------------------------------------- Physical Exam BP 123/79 (BP Location: Left Arm, Patient Position: Sitting, Cuff Size: Large)   Pulse 70   Ht 5\' 5"  (1.651 m)   Wt 238 lb (108 kg)   SpO2 97%   BMI 39.61 kg/m   Physical Exam Constitutional:      Appearance: Normal appearance.  HENT:     Head: Normocephalic and atraumatic.  Eyes:     General: No scleral icterus. Musculoskeletal:  Cervical back: Neck supple.  Neurological:     General: No focal deficit present.     Mental Status: He is alert.  Psychiatric:        Mood and Affect: Mood normal.        Behavior: Behavior normal.     ------------------------------------------------------------------------------------------------------------------------------------------------------------------------------------------------------------------- Assessment and Plan  Chest pain Epigastric and chest pain.  Improved with GI cocktail now on protonix.  Discussed making changes to diet and lifestyle.  Will continue protonix and f/u in 3 months.    Meds ordered this  encounter  Medications   pantoprazole (PROTONIX) 40 MG tablet    Sig: Take 1 tablet (40 mg total) by mouth daily for 14 days.    Dispense:  90 tablet    Refill:  0    Return in about 3 months (around 06/20/2023) for GERD.    This visit occurred during the SARS-CoV-2 public health emergency.  Safety protocols were in place, including screening questions prior to the visit, additional usage of staff PPE, and extensive cleaning of exam room while observing appropriate contact time as indicated for disinfecting solutions.

## 2023-06-23 ENCOUNTER — Ambulatory Visit (INDEPENDENT_AMBULATORY_CARE_PROVIDER_SITE_OTHER): Payer: No Typology Code available for payment source | Admitting: Family Medicine

## 2023-06-23 ENCOUNTER — Encounter: Payer: Self-pay | Admitting: Family Medicine

## 2023-06-23 VITALS — BP 141/81 | HR 64 | Ht 65.0 in | Wt 236.0 lb

## 2023-06-23 DIAGNOSIS — K219 Gastro-esophageal reflux disease without esophagitis: Secondary | ICD-10-CM

## 2023-06-23 DIAGNOSIS — R519 Headache, unspecified: Secondary | ICD-10-CM | POA: Diagnosis not present

## 2023-06-23 NOTE — Assessment & Plan Note (Signed)
 Resolved with reduction in alcohol intake, dietary changes well as omeprazole use.  Encouraged continued dietary change.

## 2023-06-23 NOTE — Progress Notes (Signed)
 Paul Leon - 43 y.o. male MRN [id number]  Date of birth: 25-Oct-1980  Subjective Chief Complaint  Patient presents with   Headache    HPI Paul Leon is a 43 y.o. male here today for follow-up visit.  He has made significant lifestyle and dietary changes since his last visit and is taking omeprazole .  Reports that reflux symptoms have resolved at this point.  He has not had any nausea.  He has cut back significantly on his alcohol intake.  He has noted that sometimes after eating he does get a mild headache.  He has not noted any certain foods that seem to trigger this.  ROS:  A comprehensive ROS was completed and negative except as noted per HPI  Allergies  Allergen Reactions   Pork Allergy Rash    History reviewed. No pertinent past medical history.  Past Surgical History:  Procedure Laterality Date   SINOSCOPY     TONSILLECTOMY      Social History   Socioeconomic History   Marital status: Married    Spouse name: Not on file   Number of children: 1   Years of education: Not on file   Highest education level: Not on file  Occupational History   Not on file  Tobacco Use   Smoking status: Never   Smokeless tobacco: Never  Vaping Use   Vaping status: Never Used  Substance and Sexual Activity   Alcohol use: Yes    Alcohol/week: 2.0 - 3.0 standard drinks of alcohol    Types: 2 - 3 Cans of beer per week   Drug use: Never   Sexual activity: Yes    Partners: Female  Other Topics Concern   Not on file  Social History Narrative   Not on file   Social Drivers of Health   Financial Resource Strain: Not on file  Food Insecurity: No Food Insecurity (04/24/2021)   Received from [facility], [facility]   Hunger Vital Sign    Worried About Running Out of Food in the Last Year: Never true    Ran Out of Food in the Last Year: Never true  Transportation Needs: No Transportation Needs (03/17/2023)   PRAPARE - Administrator, Civil Service (Medical): No     Lack of Transportation (Non-Medical): No  Physical Activity: Not on file  Stress: Not on file  Social Connections: Unknown (10/25/2021)   Received from [facility], [facility]   Social Network    Social Network: Not on file    Family History  Problem Relation Age of Onset   Leukemia Maternal Grandfather    Diabetes Paternal Grandfather     Health Maintenance  Topic Date Due   COVID-19 Vaccine (3 - 2024-25 season) 07/09/2023 (Originally 02/16/2023)   INFLUENZA VACCINE  09/15/2023 (Originally 01/16/2023)   Hepatitis C Screening  03/19/2024 (Originally 11/19/1997)   HIV Screening  03/19/2024 (Originally 11/20/1994)   DTaP/Tdap/Td (2 - Tdap) 07/02/2025   HPV VACCINES  Aged Out     ----------------------------------------------------------------------------------------------------------------------------------------------------------------------------------------------------------------- Physical Exam BP (!) 141/81 (BP Location: Left Arm, Patient Position: Sitting, Cuff Size: Large)   Pulse 64   Ht 5' 5 (1.651 m)   Wt 236 lb (107 kg)   SpO2 98%   BMI 39.27 kg/m   Physical Exam Constitutional:      Appearance: He is well-developed.  HENT:     Head: Normocephalic and atraumatic.  Eyes:     General: No scleral icterus. Neurological:     Mental  Status: He is alert.  Psychiatric:        Mood and Affect: Mood normal.        Behavior: Behavior normal.     ------------------------------------------------------------------------------------------------------------------------------------------------------------------------------------------------------------------- Assessment and Plan  GERD (gastroesophageal reflux disease) Resolved with reduction in alcohol intake, dietary changes well as omeprazole  use.  Encouraged continued dietary change.  Intermittent headache Typically postprandial.  We discussed trying to keep a food journal to see if there is any certain triggers that  seem to cause this.   No orders of the defined types were placed in this encounter.   No follow-ups on file.      This visit occurred during the SARS-CoV-2 public health emergency.  Safety protocols were in place, including screening questions prior to the visit, additional usage of staff PPE, and extensive cleaning of exam room while observing appropriate contact time as indicated for disinfecting solutions.

## 2023-06-23 NOTE — Assessment & Plan Note (Signed)
 Typically postprandial.  We discussed trying to keep a food journal to see if there is any certain triggers that seem to cause this.

## 2023-06-30 ENCOUNTER — Encounter: Payer: Self-pay | Admitting: Family Medicine

## 2023-06-30 ENCOUNTER — Ambulatory Visit (INDEPENDENT_AMBULATORY_CARE_PROVIDER_SITE_OTHER): Payer: No Typology Code available for payment source | Admitting: Family Medicine

## 2023-06-30 VITALS — BP 126/92 | HR 80 | Temp 98.2°F | Ht 65.0 in | Wt 231.8 lb

## 2023-06-30 DIAGNOSIS — J111 Influenza due to unidentified influenza virus with other respiratory manifestations: Secondary | ICD-10-CM | POA: Diagnosis not present

## 2023-06-30 DIAGNOSIS — U071 COVID-19: Secondary | ICD-10-CM | POA: Diagnosis not present

## 2023-06-30 DIAGNOSIS — J029 Acute pharyngitis, unspecified: Secondary | ICD-10-CM

## 2023-06-30 DIAGNOSIS — R6889 Other general symptoms and signs: Secondary | ICD-10-CM

## 2023-06-30 DIAGNOSIS — R52 Pain, unspecified: Secondary | ICD-10-CM

## 2023-06-30 LAB — POCT RAPID STREP A (OFFICE): Rapid Strep A Screen: NEGATIVE

## 2023-06-30 LAB — POCT INFLUENZA A/B
Influenza A, POC: POSITIVE — AB
Influenza B, POC: POSITIVE — AB

## 2023-06-30 LAB — POC COVID19 BINAXNOW: SARS Coronavirus 2 Ag: POSITIVE — AB

## 2023-06-30 MED ORDER — OSELTAMIVIR PHOSPHATE 75 MG PO CAPS
75.0000 mg | ORAL_CAPSULE | Freq: Two times a day (BID) | ORAL | 0 refills | Status: DC
Start: 2023-06-30 — End: 2023-09-29

## 2023-06-30 MED ORDER — HYDROCOD POLI-CHLORPHE POLI ER 10-8 MG/5ML PO SUER
5.0000 mL | Freq: Two times a day (BID) | ORAL | 0 refills | Status: DC | PRN
Start: 2023-06-30 — End: 2023-09-29

## 2023-06-30 NOTE — Assessment & Plan Note (Signed)
 Pt tested positive for covid, pt declined paxlovid at this time, recommended we treat supportively. Have sent in cough medicine for worsening cough.

## 2023-06-30 NOTE — Assessment & Plan Note (Addendum)
 POC flu A and B positive, will treat with tamiflu  - have recommended household get on prophylactic tamiflu . Have sent in tamiflu  for wife, Paul Leon, as she is a patient of this clinic. Advised them to call pediatrician to get son on tamiflu   - gave ED precautions

## 2023-06-30 NOTE — Progress Notes (Signed)
 Acute Office Visit  Subjective:     Patient ID: Paul Leon, male    DOB: 02-20-1981, 43 y.o.   MRN: [id number]  Chief Complaint  Patient presents with   Sore Throat    Pt complains of sore throat, body aches,fever    Sore Throat  Associated symptoms include congestion and coughing. Pertinent negatives include no headaches or shortness of breath.   Patient is in today for one day of fevers, body aches, and congestion. Wife and son have been sick for one week now and recently tested positive for strep at urgent care. He also admits to a sore throat.  Review of Systems  Constitutional:  Positive for chills. Negative for fever.  HENT:  Positive for congestion.   Respiratory:  Positive for cough. Negative for shortness of breath.   Cardiovascular:  Negative for chest pain.  Neurological:  Negative for headaches.        Objective:    BP (!) 126/92 (BP Location: Left Arm, Patient Position: Sitting, Cuff Size: Large)   Pulse 80   Temp 98.2 F (36.8 C) (Oral)   Ht 5' 5 (1.651 m)   Wt 231 lb 12 oz (105.1 kg)   SpO2 95%   BMI 38.57 kg/m    Physical Exam Vitals and nursing note reviewed.  Constitutional:      General: He is not in acute distress.    Appearance: Normal appearance.  HENT:     Head: Normocephalic and atraumatic.     Right Ear: Tympanic membrane, ear canal and external ear normal.     Left Ear: Tympanic membrane, ear canal and external ear normal.     Nose: Congestion present.  Eyes:     Conjunctiva/sclera: Conjunctivae normal.  Cardiovascular:     Rate and Rhythm: Normal rate and regular rhythm.  Pulmonary:     Effort: Pulmonary effort is normal.     Breath sounds: Normal breath sounds.  Neurological:     General: No focal deficit present.     Mental Status: He is alert and oriented to person, place, and time.  Psychiatric:        Mood and Affect: Mood normal.        Behavior: Behavior normal.        Thought Content: Thought content normal.         Judgment: Judgment normal.     Results for orders placed or performed in visit on 06/30/23  POCT Influenza A/B  Result Value Ref Range   Influenza A, POC Positive (A) Negative   Influenza B, POC Positive (A) Negative  POCT rapid strep A  Result Value Ref Range   Rapid Strep A Screen Negative Negative  POC COVID-19  Result Value Ref Range   SARS Coronavirus 2 Ag Positive (A) Negative        Assessment & Plan:   Problem List Items Addressed This Visit       Respiratory   Influenza   POC flu A and B positive, will treat with tamiflu  - have recommended household get on prophylactic tamiflu . Have sent in tamiflu  for wife, Leotis, as she is a patient of this clinic. Advised them to call pediatrician to get son on tamiflu   - gave ED precautions      Relevant Medications   oseltamivir  (TAMIFLU ) 75 MG capsule     Other   COVID   Pt tested positive for covid, pt declined paxlovid  at this time, recommended we treat supportively. Have sent  in cough medicine for worsening cough.      Relevant Medications   oseltamivir  (TAMIFLU ) 75 MG capsule   Other Visit Diagnoses       Sore throat    -  Primary   Relevant Orders   POCT rapid strep A (Completed)     Flu-like symptoms       Relevant Orders   POCT Influenza A/B (Completed)     Body aches       Relevant Orders   POC COVID-19 (Completed)       Meds ordered this encounter  Medications   oseltamivir  (TAMIFLU ) 75 MG capsule    Sig: Take 1 capsule (75 mg total) by mouth 2 (two) times daily.    Dispense:  10 capsule    Refill:  0   chlorpheniramine-HYDROcodone (TUSSIONEX) 10-8 MG/5ML    Sig: Take 5 mLs by mouth every 12 (twelve) hours as needed for cough (cough, will cause drowsiness.).    Dispense:  120 mL    Refill:  0    No follow-ups on file.  Bernice GORMAN [place], DO

## 2023-08-15 ENCOUNTER — Other Ambulatory Visit: Payer: Self-pay | Admitting: Family Medicine

## 2023-09-29 ENCOUNTER — Ambulatory Visit (INDEPENDENT_AMBULATORY_CARE_PROVIDER_SITE_OTHER): Admitting: Family Medicine

## 2023-09-29 ENCOUNTER — Encounter: Payer: Self-pay | Admitting: Family Medicine

## 2023-09-29 VITALS — BP 111/73 | HR 75 | Ht 65.0 in | Wt 223.6 lb

## 2023-09-29 DIAGNOSIS — R8290 Unspecified abnormal findings in urine: Secondary | ICD-10-CM

## 2023-09-29 DIAGNOSIS — K219 Gastro-esophageal reflux disease without esophagitis: Secondary | ICD-10-CM | POA: Diagnosis not present

## 2023-09-29 DIAGNOSIS — E782 Mixed hyperlipidemia: Secondary | ICD-10-CM | POA: Diagnosis not present

## 2023-09-29 DIAGNOSIS — R7309 Other abnormal glucose: Secondary | ICD-10-CM

## 2023-09-29 NOTE — Progress Notes (Signed)
 Paul Leon - 43 y.o. male MRN [id number]  Date of birth: Oct 07, 1980  Subjective Chief Complaint  Patient presents with   Hypertension    HPI Paul Leon is a 43 y.o. male here today for follow up of GERD.  Since last visit he has made changes to diet.  Notably has cut out EtOH and Coffee.  These seem to be making a difference in his symptoms at this time.   Remains on prilosec as needed.  No side effects with this at current strength.  He would like to have all labs updated today.  Weight is down about 13lbs from 06/23/23 as well.   ROS:  A comprehensive ROS was completed and negative except as noted per HPI  Allergies  Allergen Reactions   Pork Allergy Rash    History reviewed. No pertinent past medical history.  Past Surgical History:  Procedure Laterality Date   SINOSCOPY     TONSILLECTOMY      Social History   Socioeconomic History   Marital status: Married    Spouse name: Not on file   Number of children: 1   Years of education: Not on file   Highest education level: Not on file  Occupational History   Not on file  Tobacco Use   Smoking status: Never   Smokeless tobacco: Never  Vaping Use   Vaping status: Never Used  Substance and Sexual Activity   Alcohol use: Yes    Alcohol/week: 2.0 - 3.0 standard drinks of alcohol    Types: 2 - 3 Cans of beer per week   Drug use: Never   Sexual activity: Yes    Partners: Female  Other Topics Concern   Not on file  Social History Narrative   Not on file   Social Drivers of Health   Financial Resource Strain: Not on file  Food Insecurity: No Food Insecurity (04/24/2021)   Received from [facility], [facility]   Hunger Vital Sign    Worried About Running Out of Food in the Last Year: Never true    Ran Out of Food in the Last Year: Never true  Transportation Needs: No Transportation Needs (03/17/2023)   PRAPARE - Administrator, Civil Service (Medical): No    Lack of Transportation (Non-Medical): No   Physical Activity: Not on file  Stress: Not on file  Social Connections: Unknown (10/25/2021)   Received from [facility], [facility]   Social Network    Social Network: Not on file    Family History  Problem Relation Age of Onset   Leukemia Maternal Grandfather    Diabetes Paternal Grandfather     Health Maintenance  Topic Date Due   COVID-19 Vaccine (3 - 2024-25 season) 02/16/2023   Hepatitis C Screening  03/19/2024 (Originally 11/19/1997)   HIV Screening  03/19/2024 (Originally 11/20/1994)   INFLUENZA VACCINE  01/16/2024   DTaP/Tdap/Td (2 - Tdap) 07/02/2025   HPV VACCINES  Aged Out   Meningococcal B Vaccine  Aged Out     ----------------------------------------------------------------------------------------------------------------------------------------------------------------------------------------------------------------- Physical Exam BP 111/73 (BP Location: Left Arm, Patient Position: Sitting, Cuff Size: Normal)   Pulse 75   Ht 5\' 5"  (1.651 m)   Wt 223 lb 9.6 oz (101.4 kg)   SpO2 98%   BMI 37.21 kg/m   Physical Exam Constitutional:      Appearance: Normal appearance.  Eyes:     General: No scleral icterus. Cardiovascular:     Rate and Rhythm: Normal rate and regular  rhythm.  Pulmonary:     Effort: Pulmonary effort is normal.     Breath sounds: Normal breath sounds.  Neurological:     Mental Status: He is alert.  Psychiatric:        Mood and Affect: Mood normal.        Behavior: Behavior normal.     ------------------------------------------------------------------------------------------------------------------------------------------------------------------------------------------------------------------- Assessment and Plan  GERD (gastroesophageal reflux disease) Combination of dietary change and weight loss have probably helped more than anything.  He may continue omeprazole as needed.    Mixed hyperlipidemia Updating labs today.    Elevated glucose Checking A1c today.    No orders of the defined types were placed in this encounter.   No follow-ups on file.

## 2023-09-29 NOTE — Assessment & Plan Note (Signed)
 Checking A1c today.

## 2023-09-29 NOTE — Assessment & Plan Note (Signed)
Updating labs today. 

## 2023-09-29 NOTE — Assessment & Plan Note (Signed)
 Combination of dietary change and weight loss have probably helped more than anything.  He may continue omeprazole as needed.

## 2023-09-30 LAB — LIPID PANEL WITH LDL/HDL RATIO
Cholesterol, Total: 192 mg/dL (ref 100–199)
HDL: 40 mg/dL (ref >39)
LDL Chol Calc (NIH): 124 mg/dL — ABNORMAL HIGH (ref 0–99)
LDL/HDL Ratio: 3.1 ratio (ref 0.0–3.6)
Triglycerides: 154 mg/dL — ABNORMAL HIGH (ref 0–149)
VLDL Cholesterol Cal: 28 mg/dL (ref 5–40)

## 2023-09-30 LAB — CBC WITH DIFFERENTIAL/PLATELET
Basophils Absolute: 0.1 10*3/uL (ref 0.0–0.2)
Basos: 1 %
EOS (ABSOLUTE): 0.3 10*3/uL (ref 0.0–0.4)
Eos: 4 %
Hematocrit: 45.5 % (ref 37.5–51.0)
Hemoglobin: 15 g/dL (ref 13.0–17.7)
Immature Grans (Abs): 0.1 10*3/uL (ref 0.0–0.1)
Immature Granulocytes: 1 %
Lymphocytes Absolute: 2.5 10*3/uL (ref 0.7–3.1)
Lymphs: 29 %
MCH: 28.9 pg (ref 26.6–33.0)
MCHC: 33 g/dL (ref 31.5–35.7)
MCV: 88 fL (ref 79–97)
Monocytes Absolute: 0.5 10*3/uL (ref 0.1–0.9)
Monocytes: 6 %
Neutrophils Absolute: 5.4 10*3/uL (ref 1.4–7.0)
Neutrophils: 59 %
Platelets: 459 10*3/uL — ABNORMAL HIGH (ref 150–450)
RBC: 5.19 x10E6/uL (ref 4.14–5.80)
RDW: 13.5 % (ref 11.6–15.4)
WBC: 8.8 10*3/uL (ref 3.4–10.8)

## 2023-09-30 LAB — CMP14+EGFR
ALT: 42 IU/L (ref 0–44)
AST: 21 IU/L (ref 0–40)
Albumin: 4.7 g/dL (ref 4.1–5.1)
Alkaline Phosphatase: 130 IU/L — ABNORMAL HIGH (ref 44–121)
BUN/Creatinine Ratio: 15 (ref 9–20)
BUN: 13 mg/dL (ref 6–24)
Bilirubin Total: 0.4 mg/dL (ref 0.0–1.2)
CO2: 21 mmol/L (ref 20–29)
Calcium: 9.9 mg/dL (ref 8.7–10.2)
Chloride: 101 mmol/L (ref 96–106)
Creatinine, Ser: 0.84 mg/dL (ref 0.76–1.27)
Globulin, Total: 3.1 g/dL (ref 1.5–4.5)
Glucose: 87 mg/dL (ref 70–99)
Potassium: 5 mmol/L (ref 3.5–5.2)
Sodium: 141 mmol/L (ref 134–144)
Total Protein: 7.8 g/dL (ref 6.0–8.5)
eGFR: 111 mL/min/{1.73_m2} (ref >59)

## 2023-09-30 LAB — HEMOGLOBIN A1C
Est. average glucose Bld gHb Est-mCnc: 117 mg/dL
Hgb A1c MFr Bld: 5.7 % — ABNORMAL HIGH (ref 4.8–5.6)

## 2023-09-30 LAB — URINALYSIS, ROUTINE W REFLEX MICROSCOPIC
Bilirubin, UA: NEGATIVE
Glucose, UA: NEGATIVE
Ketones, UA: NEGATIVE
Leukocytes,UA: NEGATIVE
Nitrite, UA: NEGATIVE
Protein,UA: NEGATIVE
RBC, UA: NEGATIVE
Specific Gravity, UA: 1.023 (ref 1.005–1.030)
Urobilinogen, Ur: 0.2 mg/dL (ref 0.2–1.0)
pH, UA: 6 (ref 5.0–7.5)

## 2023-10-06 ENCOUNTER — Encounter: Payer: Self-pay | Admitting: Family Medicine

## 2023-10-06 ENCOUNTER — Telehealth: Payer: Self-pay

## 2023-10-06 NOTE — Telephone Encounter (Signed)
 Copied from CRM [id number]. Topic: Clinical - Request for Lab/Test Order >> Oct 03, 2023 10:59 AM Bearl Botts A wrote: Reason for CRM: Patient called in for lab results from 09/29/2023. Results have not been resulted yet. Please advise.

## 2023-11-30 ENCOUNTER — Other Ambulatory Visit: Payer: Self-pay | Admitting: Family Medicine

## 2024-02-26 ENCOUNTER — Other Ambulatory Visit: Payer: Self-pay | Admitting: Family Medicine
